# Patient Record
Sex: Male | Born: 1990 | Race: Black or African American | Hispanic: No | Marital: Single | Smoking: Current some day smoker
Health system: Southern US, Community
[De-identification: ages and names within clinical notes are randomized; demographics above are authoritative.]

---

## 2013-04-26 ENCOUNTER — Emergency Department (HOSPITAL_BASED_OUTPATIENT_CLINIC_OR_DEPARTMENT_OTHER)
Admission: EM | Admit: 2013-04-26 | Discharge: 2013-04-26 | Disposition: A | Discharge status: Home / Self Care | Payer: Self-pay | Attending: Emergency Medicine | Admitting: Emergency Medicine

## 2013-04-26 ENCOUNTER — Encounter (HOSPITAL_BASED_OUTPATIENT_CLINIC_OR_DEPARTMENT_OTHER): Payer: Self-pay | Admitting: *Deleted

## 2013-04-26 DIAGNOSIS — H10213 Acute toxic conjunctivitis, bilateral: Secondary | ICD-10-CM

## 2013-04-26 DIAGNOSIS — H10219 Acute toxic conjunctivitis, unspecified eye: Secondary | ICD-10-CM | POA: Insufficient documentation

## 2013-04-26 MED ORDER — FLUORESCEIN SODIUM 1 MG OP STRP
ORAL_STRIP | OPHTHALMIC | Status: AC
  Administered 2013-04-26: 06:00:00
  Filled 2013-04-26: qty 1

## 2013-04-26 MED ORDER — TETRACAINE HCL 0.5 % OP SOLN
OPHTHALMIC | Status: AC
  Administered 2013-04-26: 06:00:00
  Filled 2013-04-26: qty 2

## 2013-04-26 MED ORDER — TOBRAMYCIN-DEXAMETHASONE 0.3-0.1 % OP SUSP
2.0000 [drp] | Freq: Four times a day (QID) | OPHTHALMIC | Status: DC
  Administered 2013-04-26: 2 [drp] via OPHTHALMIC
  Filled 2013-04-26: qty 2.5

## 2013-04-26 NOTE — ED Notes (Signed)
MD at bedside. 

## 2013-04-26 NOTE — ED Notes (Signed)
Irritation and redness to bith eyes x 2 days

## 2013-04-26 NOTE — ED Provider Notes (Signed)
CSN: [id number]     Arrival date & time 04/26/13  0550 History   None    Chief Complaint  Patient presents with  . Eye Pain   (Consider location/radiation/quality/duration/timing/severity/associated sxs/prior Treatment) HPI This is a 22 year old male with a two-day history of eye irritation and erythema. He denies frank pain but states his eyes do burn. He is not sure what triggered it but it is exacerbated by a chemical mist he is exposed to at work. There's been some serous discharge. He denies visual acuity changes. He denies cold symptoms.  No past medical history on file. No past surgical history on file. No family history on file. History  Substance Use Topics  . Smoking status: Not on file  . Smokeless tobacco: Not on file  . Alcohol Use: Not on file    Review of Systems  All other systems reviewed and are negative.    Allergies  Review of patient's allergies indicates not on file.  Home Medications  No current outpatient prescriptions on file. BP 126/72  Pulse 60  Temp(Src) 98.3 F (36.8 C) (Oral)  Resp 18  Ht 5\' 9"  (1.753 m)  Wt 150 lb (68.04 kg)  BMI 22.14 kg/m2  SpO2 99%  Physical Exam General: Well-developed, well-nourished male in no acute distress; appearance consistent with age of record HENT: normocephalic; atraumatic Eyes: pupils equal, round and reactive to light; extraocular muscles intact; bilateral conjunctival erythema with serous discharge; no fluorescein uptake Neck: supple Heart: regular rate and rhythm Lungs: clear to auscultation bilaterally Abdomen: soft; nondistended Extremities: No deformity; full range of motion Neurologic: Awake, alert and oriented; motor function intact in all extremities and symmetric; no facial droop Skin: Warm and dry Psychiatric: Normal mood and affect    ED Course  Procedures (including critical care time)  MDM   We'll treat for chemical conjunctivitis.    Hanley Seamen, MD 04/26/13 [phone]

## 2013-06-12 ENCOUNTER — Encounter (HOSPITAL_BASED_OUTPATIENT_CLINIC_OR_DEPARTMENT_OTHER): Payer: Self-pay | Admitting: Emergency Medicine

## 2013-06-12 ENCOUNTER — Emergency Department (HOSPITAL_BASED_OUTPATIENT_CLINIC_OR_DEPARTMENT_OTHER): Payer: BC Managed Care – PPO

## 2013-06-12 ENCOUNTER — Emergency Department (HOSPITAL_BASED_OUTPATIENT_CLINIC_OR_DEPARTMENT_OTHER)
Admission: EM | Admit: 2013-06-12 | Discharge: 2013-06-12 | Disposition: A | Discharge status: Home / Self Care | Payer: BC Managed Care – PPO | Attending: Emergency Medicine | Admitting: Emergency Medicine

## 2013-06-12 DIAGNOSIS — F17200 Nicotine dependence, unspecified, uncomplicated: Secondary | ICD-10-CM | POA: Insufficient documentation

## 2013-06-12 DIAGNOSIS — S20211A Contusion of right front wall of thorax, initial encounter: Secondary | ICD-10-CM

## 2013-06-12 DIAGNOSIS — S20219A Contusion of unspecified front wall of thorax, initial encounter: Secondary | ICD-10-CM | POA: Insufficient documentation

## 2013-06-12 MED ORDER — HYDROCODONE-ACETAMINOPHEN 5-325 MG PO TABS
2.0000 | ORAL_TABLET | Freq: Once | ORAL | Status: AC
  Administered 2013-06-12: 1 via ORAL
  Administered 2013-06-12: 2 via ORAL
  Filled 2013-06-12: qty 2

## 2013-06-12 MED ORDER — HYDROCODONE-ACETAMINOPHEN 5-325 MG PO TABS
ORAL_TABLET | ORAL | Status: AC
  Administered 2013-06-12: 1 via ORAL
  Filled 2013-06-12: qty 1

## 2013-06-12 MED ORDER — IBUPROFEN 800 MG PO TABS: 800.0000 mg | ORAL_TABLET | Freq: Three times a day (TID) | ORAL | Status: DC

## 2013-06-12 MED ORDER — HYDROCODONE-ACETAMINOPHEN 5-325 MG PO TABS: 2.0000 | ORAL_TABLET | ORAL | Status: DC | PRN
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
## 2013-06-14 NOTE — ED Provider Notes (Signed)
History/physical exam/procedure(s) were performed by non-physician practitioner and as supervising physician I was immediately available for consultation/collaboration. I have reviewed all notes and am in agreement with care and plan.   Kaytlen Lightsey S Skylie Hiott, MD 06/14/13 1513 

## 2013-08-02 ENCOUNTER — Encounter (HOSPITAL_BASED_OUTPATIENT_CLINIC_OR_DEPARTMENT_OTHER): Payer: Self-pay | Admitting: Emergency Medicine

## 2013-08-02 ENCOUNTER — Emergency Department (HOSPITAL_BASED_OUTPATIENT_CLINIC_OR_DEPARTMENT_OTHER)
Admission: EM | Admit: 2013-08-02 | Discharge: 2013-08-02 | Disposition: A | Discharge status: Home / Self Care | Payer: BC Managed Care – PPO | Attending: Emergency Medicine | Admitting: Emergency Medicine

## 2013-08-02 DIAGNOSIS — Z791 Long term (current) use of non-steroidal anti-inflammatories (NSAID): Secondary | ICD-10-CM | POA: Insufficient documentation

## 2013-08-02 DIAGNOSIS — J020 Streptococcal pharyngitis: Secondary | ICD-10-CM

## 2013-08-02 DIAGNOSIS — R52 Pain, unspecified: Secondary | ICD-10-CM | POA: Insufficient documentation

## 2013-08-02 DIAGNOSIS — F17200 Nicotine dependence, unspecified, uncomplicated: Secondary | ICD-10-CM | POA: Insufficient documentation

## 2013-08-02 LAB — RAPID STREP SCREEN (MED CTR MEBANE ONLY): Streptococcus, Group A Screen (Direct): POSITIVE — AB

## 2013-08-02 MED ORDER — CEPHALEXIN 500 MG PO CAPS: 500.0000 mg | ORAL_CAPSULE | Freq: Four times a day (QID) | ORAL | Status: DC

## 2013-08-02 NOTE — ED Provider Notes (Signed)
CSN: [id number]     Arrival date & time 08/02/13  1831 History  This chart was scribed for Geoffery Lyons, MD by Landis Gandy, ED Scribe. This patient was seen in room MH01/MH01 and the patient's care was started at [facility] PM     Chief Complaint  Patient presents with  . Sore Throat   The history is provided by the patient. No language interpreter was used.  HPI Comments: Kevin Cummings is a 22 y.o. male who presents to the Emergency Department complaining of new, constant, gradually worsening sore throat onset this morning. His associated symptoms are body aches. He has not tried and OTC medications for his symptoms. He denies any cough, fever, nausea, vomiting, diarrhea. He also denies any recent sick contacts. He has no associated symptoms.   History reviewed. No pertinent past medical history. History reviewed. No pertinent past surgical history. No family history on file. History  Substance Use Topics  . Smoking status: Current Every Day Smoker    Types: Cigars  . Smokeless tobacco: Not on file  . Alcohol Use: Yes     Comment: socially    Review of Systems A complete 10 system review of systems was obtained and all systems are negative except as noted in the HPI and PMH.   Allergies  Review of patient's allergies indicates not on file.  Home Medications   Current Outpatient Rx  Name  Route  Sig  Dispense  Refill  . ibuprofen (ADVIL,MOTRIN) 800 MG tablet   Oral   Take 1 tablet (800 mg total) by mouth 3 (three) times daily.   21 tablet   0    Triage Vitals: BP 131/78  Pulse 88  Temp(Src) 98.9 F (37.2 C) (Oral)  Resp 20  Ht 5\' 9"  (1.753 m)  Wt 140 lb (63.504 kg)  BMI 20.67 kg/m2  SpO2 100% Physical Exam  Nursing note and vitals reviewed. Constitutional: He is oriented to person, place, and time. He appears well-developed and well-nourished. No distress.  HENT:  Head: Normocephalic and atraumatic.  Mouth/Throat: Posterior oropharyngeal erythema present. No  oropharyngeal exudate.  Oropharynx is erythematous w/o exudate.   Eyes: Conjunctivae and EOM are normal. No scleral icterus.  Neck: Normal range of motion.  Cardiovascular: Normal rate, regular rhythm, normal heart sounds and intact distal pulses.   Pulmonary/Chest: Effort normal and breath sounds normal. No respiratory distress. He has no wheezes. He has no rales.  Musculoskeletal: Normal range of motion.  Lymphadenopathy:    He has cervical adenopathy.  Neurological: He is alert and oriented to person, place, and time.  Skin: Skin is warm and dry. No rash noted. He is not diaphoretic. No erythema. No pallor.  Psychiatric: He has a normal mood and affect. His behavior is normal.    ED Course  Procedures (including critical care time) DIAGNOSTIC STUDIES: Oxygen Saturation is 100% on RA, normal by my interpretation.    COORDINATION OF CARE: 8:04 PM- Will order rapid strep test. Pt advised of plan for treatment and pt agrees.  Labs Review Labs Reviewed - No data to display Imaging Review No results found.    MDM  No diagnosis found. Strep test is positive. We'll treat with Keflex and when necessary followup   I personally performed the services described in this documentation, which was scribed in my presence. The recorded information has been reviewed and is accurate.       Geoffery Lyons, MD 08/02/13 2024

## 2013-08-02 NOTE — ED Notes (Signed)
Onset of sore throat, body aches this morning.  Denies nausea, vomiting, diarrhea, cough.

## 2013-09-14 ENCOUNTER — Encounter (HOSPITAL_BASED_OUTPATIENT_CLINIC_OR_DEPARTMENT_OTHER): Payer: Self-pay | Admitting: Emergency Medicine

## 2013-09-14 ENCOUNTER — Emergency Department (HOSPITAL_BASED_OUTPATIENT_CLINIC_OR_DEPARTMENT_OTHER)
Admission: EM | Admit: 2013-09-14 | Discharge: 2013-09-15 | Disposition: A | Discharge status: Home / Self Care | Payer: BC Managed Care – PPO | Attending: Emergency Medicine | Admitting: Emergency Medicine

## 2013-09-14 DIAGNOSIS — F17200 Nicotine dependence, unspecified, uncomplicated: Secondary | ICD-10-CM | POA: Insufficient documentation

## 2013-09-14 DIAGNOSIS — J029 Acute pharyngitis, unspecified: Secondary | ICD-10-CM | POA: Insufficient documentation

## 2013-09-14 LAB — RAPID STREP SCREEN (MED CTR MEBANE ONLY): Streptococcus, Group A Screen (Direct): NEGATIVE

## 2013-09-14 NOTE — ED Notes (Signed)
Pt c/o sore throat x 2 days- denies known fever

## 2013-09-14 NOTE — ED Notes (Signed)
Sore throat x 2 days,  Runny nose  Denies any other sx

## 2013-09-15 MED ORDER — BENZOCAINE (TOPICAL) 20 % EX AERO: 1.0000 "application " | INHALATION_SPRAY | Freq: Four times a day (QID) | CUTANEOUS | Status: DC | PRN

## 2013-09-15 NOTE — ED Provider Notes (Signed)
CSN: [id number]     Arrival date & time 09/14/13  2307 History   First MD Initiated Contact with Patient 09/15/13 0129     Chief Complaint  Patient presents with  . Sore Throat   (Consider location/radiation/quality/duration/timing/severity/associated sxs/prior Treatment) HPI This is a 23 year old male with a three-day history of sore throat. He is experiencing moderate pain with swallowing or talking. He denies fever or other cold symptoms such as nasal congestion, rhinorrhea, cough or shortness of breath. Symptoms are similar to strep throat he had in December.  History reviewed. No pertinent past medical history. History reviewed. No pertinent past surgical history. No family history on file. History  Substance Use Topics  . Smoking status: Current Every Day Smoker    Types: Cigars  . Smokeless tobacco: Never Used  . Alcohol Use: Yes     Comment: socially    Review of Systems  All other systems reviewed and are negative.    Allergies  Review of patient's allergies indicates no known allergies.  Home Medications   Current Outpatient Rx  Name  Route  Sig  Dispense  Refill  . cephALEXin (KEFLEX) 500 MG capsule   Oral   Take 1 capsule (500 mg total) by mouth 4 (four) times daily.   28 capsule   0   . ibuprofen (ADVIL,MOTRIN) 800 MG tablet   Oral   Take 1 tablet (800 mg total) by mouth 3 (three) times daily.   21 tablet   0    BP 121/72  Pulse 79  Temp(Src) 98.6 F (37 C) (Oral)  Resp 18  Ht 5\' 9"  (1.753 m)  Wt 149 lb (67.586 kg)  BMI 21.99 kg/m2  SpO2 100%  Physical Exam General: Well-developed, well-nourished male in no acute distress; appearance consistent with age of record HENT: normocephalic; atraumatic; no pharyngeal erythema or exudate Eyes: pupils equal, round and reactive to light; extraocular muscles intact Neck: supple Heart: regular rate and rhythm; no murmurs, rubs or gallops Lungs: clear to auscultation bilaterally Abdomen: soft;  nondistended; nontender; no masses or hepatosplenomegaly; bowel sounds present Extremities: No deformity; full range of motion Neurologic: Awake, alert and oriented; motor function intact in all extremities and symmetric; no facial droop Skin: Warm and dry Psychiatric: Normal mood and affect    ED Course  Procedures (including critical care time)   MDM   Nursing notes and vitals signs, including pulse oximetry, reviewed.  Summary of this visit's results, reviewed by myself:  Labs:  Results for orders placed during the hospital encounter of 09/14/13 (from the past 24 hour(s))  RAPID STREP SCREEN     Status: None   Collection Time    09/14/13 11:19 PM      Result Value Range   Streptococcus, Group A Screen (Direct) NEGATIVE  NEGATIVE        Hanley SeamenJohn L Hendrix Console, MD 09/15/13 0139

## 2013-09-16 LAB — CULTURE, GROUP A STREP

## 2014-01-05 ENCOUNTER — Telehealth (HOSPITAL_BASED_OUTPATIENT_CLINIC_OR_DEPARTMENT_OTHER): Payer: Self-pay | Admitting: Emergency Medicine

## 2014-01-05 ENCOUNTER — Encounter (HOSPITAL_BASED_OUTPATIENT_CLINIC_OR_DEPARTMENT_OTHER): Payer: Self-pay | Admitting: Emergency Medicine

## 2014-01-05 ENCOUNTER — Emergency Department (HOSPITAL_BASED_OUTPATIENT_CLINIC_OR_DEPARTMENT_OTHER)
Admission: EM | Admit: 2014-01-05 | Discharge: 2014-01-05 | Disposition: A | Discharge status: Home / Self Care | Payer: BC Managed Care – PPO | Attending: Emergency Medicine | Admitting: Emergency Medicine

## 2014-01-05 ENCOUNTER — Emergency Department (HOSPITAL_BASED_OUTPATIENT_CLINIC_OR_DEPARTMENT_OTHER): Payer: BC Managed Care – PPO

## 2014-01-05 DIAGNOSIS — S01511A Laceration without foreign body of lip, initial encounter: Secondary | ICD-10-CM

## 2014-01-05 DIAGNOSIS — R6884 Jaw pain: Secondary | ICD-10-CM

## 2014-01-05 DIAGNOSIS — S01501A Unspecified open wound of lip, initial encounter: Secondary | ICD-10-CM | POA: Insufficient documentation

## 2014-01-05 DIAGNOSIS — Z792 Long term (current) use of antibiotics: Secondary | ICD-10-CM | POA: Insufficient documentation

## 2014-01-05 DIAGNOSIS — Z23 Encounter for immunization: Secondary | ICD-10-CM | POA: Insufficient documentation

## 2014-01-05 DIAGNOSIS — F17200 Nicotine dependence, unspecified, uncomplicated: Secondary | ICD-10-CM | POA: Insufficient documentation

## 2014-01-05 DIAGNOSIS — Z791 Long term (current) use of non-steroidal anti-inflammatories (NSAID): Secondary | ICD-10-CM | POA: Insufficient documentation

## 2014-01-05 DIAGNOSIS — L089 Local infection of the skin and subcutaneous tissue, unspecified: Secondary | ICD-10-CM

## 2014-01-05 MED ORDER — TETANUS-DIPHTH-ACELL PERTUSSIS 5-2.5-18.5 LF-MCG/0.5 IM SUSP
0.5000 mL | Freq: Once | INTRAMUSCULAR | Status: AC
  Administered 2014-01-05: 0.5 mL via INTRAMUSCULAR
  Filled 2014-01-05: qty 0.5

## 2014-01-05 MED ORDER — OXYCODONE-ACETAMINOPHEN 5-325 MG PO TABS
2.0000 | ORAL_TABLET | Freq: Once | ORAL | Status: AC
  Administered 2014-01-05: 2 via ORAL
  Filled 2014-01-05: qty 2

## 2014-01-05 MED ORDER — CLINDAMYCIN HCL 150 MG PO CAPS: 150.0000 mg | ORAL_CAPSULE | Freq: Four times a day (QID) | ORAL | Status: DC

## 2014-01-05 MED ORDER — PENICILLIN V POTASSIUM 250 MG PO TABS
500.0000 mg | ORAL_TABLET | Freq: Once | ORAL | Status: AC
  Administered 2014-01-05: 500 mg via ORAL
  Filled 2014-01-05: qty 2

## 2014-01-05 NOTE — Discharge Instructions (Signed)
Facial Infection °You have an infection of your face. This requires special attention to help prevent serious problems. Infections in facial wounds can cause poor healing and scars. They can also spread to deeper tissues, especially around the eye. Wound and dental infections can lead to sinusitis, infection of the eye socket, and even meningitis. Permanent damage to the skin, eye, and nervous system may result if facial infections are not treated properly. With severe infections, hospital care for IV antibiotic injections may be needed if they don't respond to oral antibiotics. °Antibiotics must be taken for the full course to insure the infection is eliminated. If the infection came from a bad tooth, it may have to be extracted when the infection is under control. Warm compresses may be applied to reduce skin irritation and remove drainage. °You might need a tetanus shot now if: °· You cannot remember when your last tetanus shot was. °· You have never had a tetanus shot. °· The object that caused your wound was dirty. °If you need a tetanus shot, and you decide not to get one, there is a rare chance of getting tetanus. Sickness from tetanus can be serious. If you got a tetanus shot, your arm may swell, get red and warm to the touch at the shot site. This is common and not a problem. °SEEK IMMEDIATE MEDICAL CARE IF:  °· You have increased swelling, redness, or trouble breathing. °· You have a severe headache, dizziness, nausea, or vomiting. °· You develop problems with your eyesight. °· You have a fever. °Document Released: 09/10/2004 Document Revised: 10/26/2011 Document Reviewed: 08/03/2005 °[employer]® Patient Information ©2014 [employer]. ° °
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
## 2015-06-19 ENCOUNTER — Encounter (HOSPITAL_BASED_OUTPATIENT_CLINIC_OR_DEPARTMENT_OTHER): Payer: Self-pay

## 2015-06-19 ENCOUNTER — Emergency Department (HOSPITAL_BASED_OUTPATIENT_CLINIC_OR_DEPARTMENT_OTHER)
Admission: EM | Admit: 2015-06-19 | Discharge: 2015-06-19 | Disposition: A | Discharge status: Home / Self Care | Payer: Self-pay | Attending: Emergency Medicine | Admitting: Emergency Medicine

## 2015-06-19 DIAGNOSIS — Z792 Long term (current) use of antibiotics: Secondary | ICD-10-CM | POA: Insufficient documentation

## 2015-06-19 DIAGNOSIS — F17210 Nicotine dependence, cigarettes, uncomplicated: Secondary | ICD-10-CM | POA: Insufficient documentation

## 2015-06-19 DIAGNOSIS — N342 Other urethritis: Secondary | ICD-10-CM | POA: Insufficient documentation

## 2015-06-19 DIAGNOSIS — Z791 Long term (current) use of non-steroidal anti-inflammatories (NSAID): Secondary | ICD-10-CM | POA: Insufficient documentation

## 2015-06-19 LAB — URINALYSIS, ROUTINE W REFLEX MICROSCOPIC
Bilirubin Urine: NEGATIVE
Glucose, UA: NEGATIVE mg/dL
HGB URINE DIPSTICK: NEGATIVE
Ketones, ur: NEGATIVE mg/dL
NITRITE: NEGATIVE
PROTEIN: NEGATIVE mg/dL
Specific Gravity, Urine: 1.025 (ref 1.005–1.030)
UROBILINOGEN UA: 1 mg/dL (ref 0.0–1.0)
pH: 6.5 (ref 5.0–8.0)

## 2015-06-19 LAB — URINE MICROSCOPIC-ADD ON

## 2015-06-19 MED ORDER — ONDANSETRON 4 MG PO TBDP
4.0000 mg | ORAL_TABLET | Freq: Once | ORAL | Status: AC
  Administered 2015-06-19: 4 mg via ORAL
  Filled 2015-06-19: qty 1

## 2015-06-19 MED ORDER — AZITHROMYCIN 250 MG PO TABS
1000.0000 mg | ORAL_TABLET | Freq: Once | ORAL | Status: AC
  Administered 2015-06-19: 1000 mg via ORAL
  Filled 2015-06-19: qty 4

## 2015-06-19 MED ORDER — CEFTRIAXONE SODIUM 250 MG IJ SOLR
250.0000 mg | Freq: Once | INTRAMUSCULAR | Status: AC
  Administered 2015-06-19: 250 mg via INTRAMUSCULAR
  Filled 2015-06-19: qty 250
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
## 2015-06-19 NOTE — ED Notes (Signed)
Pt vomited up medications

## 2015-06-19 NOTE — Discharge Instructions (Signed)
Urethritis, Adult °Urethritis is an inflammation of the tube through which urine exits your bladder (urethra).  °CAUSES °Urethritis is often caused by an infection in your urethra. The infection can be viral, like herpes. The infection can also be bacterial, like gonorrhea. °RISK FACTORS °Risk factors of urethritis include: °· Having sex without using a condom. °· Having multiple sexual partners. °· Having poor hygiene. °SIGNS AND SYMPTOMS °Symptoms of urethritis are less noticeable in women than in men. These symptoms include: °· Burning feeling when you urinate (dysuria). °· Discharge from your urethra. °· Blood in your urine (hematuria). °· Urinating more than usual. °DIAGNOSIS  °To confirm a diagnosis of urethritis, your health care provider will do the following: °· Ask about your sexual history. °· Perform a physical exam. °· Have you provide a sample of your urine for lab testing. °· Use a cotton swab to gently collect a sample from your urethra for lab testing. °TREATMENT  °It is important to treat urethritis. Depending on the cause, untreated urethritis may lead to serious genital infections and possibly infertility. Urethritis caused by a bacterial infection is treated with antibiotic medicine. All sexual partners must be treated.  °HOME CARE INSTRUCTIONS °· Do not have sex until the test results are known and treatment is completed, even if your symptoms go away before you finish treatment. °· If you were prescribed an antibiotic, finish it all even if you start to feel better. °SEEK MEDICAL CARE IF:  °· Your symptoms are not improved in 3 days. °· Your symptoms are getting worse. °· You develop abdominal pain or pelvic pain (in women). °· You develop joint pain. °· You have a fever. °SEEK IMMEDIATE MEDICAL CARE IF:  °· You have severe pain in the belly, back, or side. °· You have repeated vomiting. °MAKE SURE YOU: °· Understand these instructions. °· Will watch your condition. °· Will get help right away  if you are not doing well or get worse. °  °This information is not intended to replace advice given to you by your health care provider. Make sure you discuss any questions you have with your health care provider. °  °Document Released: 01/27/2001 Document Revised: 12/18/2014 Document Reviewed: 04/03/2013 °Elsevier Interactive Patient Education ©2016 Elsevier Inc. ° °

## 2015-06-19 NOTE — ED Notes (Signed)
Pt verbalizes understanding of d/c instructions and denies any further needs at this time. 

## 2015-06-19 NOTE — ED Notes (Signed)
Pt c/o white penile discharge, painful urination and subjective fever since yesterday

## 2015-06-19 NOTE — ED Notes (Signed)
Pt c/o white discharge and painful urination.  He states he has had unprotected sex with one partner in the last month and was last tested for STI in April 2016 with no positive results.

## 2015-06-20 LAB — RPR: RPR Ser Ql: NONREACTIVE

## 2015-06-20 LAB — HIV ANTIBODY (ROUTINE TESTING W REFLEX): HIV Screen 4th Generation wRfx: NONREACTIVE

## 2015-06-21 ENCOUNTER — Telehealth (HOSPITAL_COMMUNITY): Payer: Self-pay

## 2015-06-21 LAB — GC/CHLAMYDIA PROBE AMP (~~LOC~~) NOT AT ARMC
Chlamydia: POSITIVE — AB
NEISSERIA GONORRHEA: POSITIVE — AB

## 2015-06-21 NOTE — Telephone Encounter (Signed)
[facility].  (+) gonorrhea and chlamydia.  Treated with Zithromax and Rocephin.  DHHS form completed and faxed.   06/21/2015 @ 15:48 LVM requesting callback.

## 2015-06-22 ENCOUNTER — Telehealth (HOSPITAL_COMMUNITY): Payer: Self-pay

## 2015-06-23 ENCOUNTER — Telehealth (HOSPITAL_COMMUNITY): Payer: Self-pay

## 2015-06-23 NOTE — Telephone Encounter (Signed)
Unable to reach by telephone. Letter sent to address on record.  

## 2015-07-10 ENCOUNTER — Telehealth (HOSPITAL_COMMUNITY): Payer: Self-pay

## 2015-07-10 NOTE — Telephone Encounter (Signed)
Unable to reach by phone or mail.  Chart closed.   

## 2015-09-08 ENCOUNTER — Emergency Department (HOSPITAL_BASED_OUTPATIENT_CLINIC_OR_DEPARTMENT_OTHER)
Admission: EM | Admit: 2015-09-08 | Discharge: 2015-09-08 | Disposition: A | Discharge status: Home / Self Care | Payer: Self-pay | Attending: Emergency Medicine | Admitting: Emergency Medicine

## 2015-09-08 ENCOUNTER — Encounter (HOSPITAL_BASED_OUTPATIENT_CLINIC_OR_DEPARTMENT_OTHER): Payer: Self-pay | Admitting: Emergency Medicine

## 2015-09-08 DIAGNOSIS — F17210 Nicotine dependence, cigarettes, uncomplicated: Secondary | ICD-10-CM | POA: Insufficient documentation

## 2015-09-08 DIAGNOSIS — H6123 Impacted cerumen, bilateral: Secondary | ICD-10-CM | POA: Insufficient documentation

## 2015-09-08 DIAGNOSIS — H6692 Otitis media, unspecified, left ear: Secondary | ICD-10-CM | POA: Insufficient documentation

## 2015-09-08 DIAGNOSIS — H9202 Otalgia, left ear: Secondary | ICD-10-CM

## 2015-09-08 MED ORDER — AMOXICILLIN 500 MG PO CAPS
500.0000 mg | ORAL_CAPSULE | Freq: Once | ORAL | Status: AC
  Administered 2015-09-08: 500 mg via ORAL
  Filled 2015-09-08: qty 1

## 2015-09-08 MED ORDER — CARBAMIDE PEROXIDE 6.5 % OT SOLN: 5.0000 [drp] | Freq: Two times a day (BID) | OTIC | Status: DC

## 2015-09-08 MED ORDER — IBUPROFEN 800 MG PO TABS
800.0000 mg | ORAL_TABLET | Freq: Once | ORAL | Status: AC
  Administered 2015-09-08: 800 mg via ORAL
  Filled 2015-09-08: qty 1

## 2015-09-08 NOTE — ED Notes (Signed)
PA at bedside.

## 2015-09-08 NOTE — Discharge Instructions (Signed)
1. Medications: debrox, usual home medications 2. Treatment: rest, drink plenty of fluids, do not use q-tips 3. Follow Up: Please followup with your primary doctor in 2-3 days for discussion of your diagnoses and further evaluation after today's visit; if you do not have a primary care doctor use the resource guide provided to find one; Please return to the ER for worsening symptoms    Cerumen Impaction The structures of the external ear canal secrete a waxy substance known as cerumen. Excess cerumen can build up in the ear canal, causing a condition known as cerumen impaction. Cerumen impaction can cause ear pain and disrupt the function of the ear. The rate of cerumen production differs for each individual. In certain individuals, the configuration of the ear canal may decrease his or her ability to naturally remove cerumen. CAUSES Cerumen impaction is caused by excessive cerumen production or buildup. RISK FACTORS  Frequent use of swabs to clean ears.  Having narrow ear canals.  Having eczema.  Being dehydrated. SIGNS AND SYMPTOMS  Diminished hearing.  Ear drainage.  Ear pain.  Ear itch. TREATMENT Treatment may involve:  Over-the-counter or prescription ear drops to soften the cerumen.  Removal of cerumen by a health care provider. This may be done with:  Irrigation with warm water. This is the most common method of removal.  Ear curettes and other instruments.  Surgery. This may be done in severe cases. HOME CARE INSTRUCTIONS  Take medicines only as directed by your health care provider.  Do not insert objects into the ear with the intent of cleaning the ear. PREVENTION  Do not insert objects into the ear, even with the intent of cleaning the ear. Removing cerumen as a part of normal hygiene is not necessary, and the use of swabs in the ear canal is not recommended.  Drink enough water to keep your urine clear or pale yellow.  Control your eczema if you have  it. SEEK MEDICAL CARE IF:  You develop ear pain.  You develop bleeding from the ear.  The cerumen does not clear after you use ear drops as directed.   This information is not intended to replace advice given to you by your health care provider. Make sure you discuss any questions you have with your health care provider.   Document Released: 09/10/2004 Document Revised: 08/24/2014 Document Reviewed: 03/20/2015 Elsevier Interactive Patient Education 2016 [employer].    Emergency Department Resource Guide 1) Find a Doctor and Pay Out of Pocket Although you won't have to find out who is covered by your insurance plan, it is a good idea to ask around and get recommendations. You will then need to call the office and see if the doctor you have chosen will accept you as a new patient and what types of options they offer for patients who are self-pay. Some doctors offer discounts or will set up payment plans for their patients who do not have insurance, but you will need to ask so you aren't surprised when you get to your appointment.  2) Contact Your Local Health Department Not all health departments have doctors that can see patients for sick visits, but many do, so it is worth a call to see if yours does. If you don't know where your local health department is, you can check in your phone book. The CDC also has a tool to help you locate your state's health department, and many state websites also have listings of all of their local health  departments.  3) Find a Walk-in Clinic If your illness is not likely to be very severe or complicated, you may want to try a walk in clinic. These are popping up all over the country in pharmacies, drugstores, and shopping centers. They're usually staffed by nurse practitioners or physician assistants that have been trained to treat common illnesses and complaints. They're usually fairly quick and inexpensive. However, if you have serious medical issues or  chronic medical problems, these are probably not your best option.  No Primary Care Doctor: - Call Health Connect at  [phone] - they can help you locate a primary care doctor that  accepts your insurance, provides certain services, etc. - Physician Referral Service- [phone]  Chronic Pain Problems: Organization         Address  Phone   Notes  [facility]  [phone] Patients need to be referred by their primary care doctor.   Medication Assistance: Organization         Address  Phone   Notes  [facility] Med[facility] [address] [place], [place] [address] [phone] --Must be a resident of [facility] -- Must have NO insurance coverage whatsoever (no Medicaid/ Medicare, etc.) -- The pt. MUST have a primary care doctor that directs their care regularly and follows them in the community   [facility]ist  [phone]   [employer]  [phone]    Agencies that provide inexpensive medical care: Organization         Address  Phone   Notes  [facility]  [phone]   [facility]    [phone]   [facility] [address] [place], [place] [address] [phone]   [facility] [address] [place] [phone]   Planned Parenthood    [phone]   [facility]    [phone]   [facility]  [address], [place] Phone:  [phone], Fax:  [phone] Hours of Operation:  9 am - 6 pm, M-F.  Also accepts Medicaid/Medicare and self-pay.  [facility]  [address], [place] Phone: [phone], Fax: [phone]. Hours of Operation:  8:30 am - 5:30 pm, M-F.  Also accepts Medicaid and self-pay.  [facility] [place] [address] [place] Phone: [phone]   [facility] [address] Natasha Bence [place], [place]  [phone], Ext. [phone] Mondays & Thursdays: 7-9 AM.  First 15 patients are seen on a first come, first serve basis.    Medicaid-accepting [facility] Providers:  Organization         Address  Phone   Notes  [facility] [address], [place] [phone] Also accepts self-pay patients.  [facility] [address] Laurell Josephs [place], [place]  [phone]   [facility] [address], [place] [phone]   [facility] [address] [place] [phone]   Renaye Rakers [address], [place]   [phone] Only accepts [place] patients after they have their name applied to their card.   Self-Pay (no insurance) in [facility]:  Organization         Address  Phone   Notes  Sickle Cell Patients, [facility] [address] [place], [place] [phone]   [facility] [address] [place], [place] [phone]   [facility]  [address],  [phone]   [facility]/Dr. Osei-Bonsu  [address] [place] or [address], [place] [phone] Phone number for both [place] and [place] locations is the same.  [facility] [address], [place] [phone]   [facility] [address] [place] or [address] [phone]   [facility] [address] [place] [phone], phone; [phone], fax Sees patients 1st and 3rd Saturday of every month.  Must not qualify for public or private insurance (i.e. Medicaid, Medicare, [place] Health Choice, Veterans' Benefits)  Household income should be no more than 200% of the poverty level The clinic cannot treat you if you are pregnant or think you are pregnant  Sexually transmitted  diseases are not treated at the clinic.    Dental Care: Organization         Address  Phone  Notes  [facility] [address] [place], [place] [phone] Accepts children up to age 87 who are enrolled in [place] or [place] Health Choice; pregnant women with a Medicaid card; and children who have applied for Medicaid or [place] Health Choice, but were declined, whose parents can pay a reduced fee at time of service.  [facility] of [facility] [place]  [address], [place] [phone] Accepts children up to age 71 who are enrolled in [place] or [place] Health Choice; pregnant women with a Medicaid card; and children who have applied for Medicaid or  Health Choice, but were declined, whose parents can pay a reduced fee at time of service.  [facility]  [address] [place], [place] [phone] Patients are seen by appointment only. Walk-ins are not accepted. [facility] will see patients 35 years of age and older. Monday - Tuesday (8am-5pm) Most Wednesdays (8:30-5pm) $30 per visit, cash only  [facility] Adult Dental Access [facility]  [address], [facility] [phone] Patients are seen by appointment only. Walk-ins are not accepted. [facility] will see patients 70 years of age and older. One Wednesday Evening (Monthly: Volunteer Based).  $30 per visit, cash only  [employer]  [phone] for adults; Children under age 45, call Graduate Pediatric Dentistry at [phone]. Children aged 66-14, please call [phone] to request a pediatric application.  Dental services are provided in all areas of dental care including fillings, crowns and bridges, complete and partial dentures, implants, gum treatment, root canals, and extractions. Preventive care is also provided. Treatment is provided to both adults and children. Patients are selected via a  lottery and there is often a waiting list.   [facility] [address], [place]  [phone] [email or web address]   [facility] [address] [place], [place] [phone], Ext. [phone] Second and Fourth Thursday of each month, opens at 6:30 AM; Clinic ends at 9 AM.  Patients are seen on a first-come first-served basis, and a limited number are seen during each clinic.   [facility]  [address] Ether Griffins [place], [place] [phone]   Eligibility Requirements You must have lived  in [place], [place], or [place] counties for at least the last three months.   You cannot be eligible for state or federal sponsored National Cityhealthcare insurance, including CIGNAVeterans Administration, [place]icaid, or [employer]icare.   You generally cannot be eligible for healthcare insurance through your employer.    How to apply: Eligibility screenings are held every Tuesday and Wednesday afternoon from 1:00 pm until 4:00 pm. You do not need an appointment for the interview!  [facility] [address], [place], [place] [phone]   [facility]  [phone]   [facility]  [phone]   [facility]  [phone]    Behavioral Health Resources in the Community: Intensive Outpatient Programs Organization         Address  Phone  Notes  [facility] [address], [place], [place] [phone]   [facility] [address], [place], [place] [phone]   [facility] [address], [place], [place]  [phone]   [facility] [address],  [place], [place] [phone] or [phone]   Substance Abuse Resources Organization         Address  Phone  Notes  [facility]  [phone]   [facility]  [phone]   [facility]  [phone]   [facility]  [phone]   Residential &  Outpatient Substance Abuse Program  [phone]   Psychological Services Organization         Address  Phone  Notes  [facility]  [phone]- [phone]   [facility]  [phone]- [phone]   [facility] [address], [place] [phone] or [phone]    Mobile Crisis Teams Organization         Address  Phone  Notes  [facility], Mobile Crisis Care Unit  [phone]   [facility]  [address] [place], [place] [phone]   Doristine LocksSharon DeEsch [address] [place] [phone]    Self-Help/Support Groups Organization         Address  Phone             Notes  [facility] [place] - variety of support groups  [phone]- [phone] Call for more information  Narcotics Anonymous (NA), [facility] [address], [place]  2 meetings at this location   Statisticianesidential Treatment Programs Organization         Address  Phone  Notes  [facility] [address],    [place]  [phone]   [facility]  [address], [place] [phone], [place], [place] [phone]   [facility] [address], [place] [phone] Admissions: 8am-3pm M-F  [facility] [address]    [place], [place] [phone]   [facility] [address], [place], [place] [phone]   [facility] [address]  [place], [place] [phone]   [facility] - Intensive Outpatient [address], [place], [place] [phone]   [facility] ([facility] [address]  [place], [place] [phone] or [phone]   [facility] ([facility]) [address] [place], [place] [phone] Accepts Medicaid  [facility] [address]  [place] [phone] Substance Abuse/Addiction Treatment   [facility] Resources Organization          Address  Phone  Notes  [facility]  [phone]   Angie FavaJulie Brannon, PhD [address], [address] [place], [place]   [phone] or [phone]   [facility]   [address] [place], [place] [phone]   [facility] [address], [place], [place] [phone] Insurance/Medicaid/sponsorship through [facility] Families [address]                                    [place], [place] [phone] Therapy/tele-psych/case  [facility] [address].   [place], [place] [phone]    Dr. Lolly MustacheArfeen  [phone]   [facility]  [employer] [facility] 1) [address],  2) [address], [place] 3)  [address], [place] [phone]  [phone]   [facility] Child Abuse Hotline [phone] or [phone] (After Hours)

## 2015-09-08 NOTE — ED Provider Notes (Signed)
CSN: [id number]     Arrival date & time 09/08/15  1940 History   First MD Initiated Contact with Patient 09/08/15 2028     Chief Complaint  Patient presents with  . Ear Pain     (Consider location/radiation/quality/duration/timing/severity/associated sxs/prior Treatment) The history is provided by the patient and medical records. No language interpreter was used.   Kevin Cummings is a 25 y.o. male  with no major medical history presents to the Emergency Department complaining of gradual, persistent, progressively worsening left otalgia onset 2 days ago. Patient reports he attempted to clean his ear with a Q-tip today but that did not improve the pain. No other treatments are to arrival. Patient denies sick contacts. No hydrating or alleviating factors. He denies rhinorrhea, post nasal drip, sore throat, cough, fevers, chills. He does endorse associated decreased hearing bilaterally.  History reviewed. No pertinent past medical history. History reviewed. No pertinent past surgical history. History reviewed. No pertinent family history. Social History  Substance Use Topics  . Smoking status: Current Every Day Smoker    Types: Cigars  . Smokeless tobacco: Never Used  . Alcohol Use: Yes     Comment: socially    Review of Systems  Constitutional: Negative for fever, diaphoresis, appetite change, fatigue and unexpected weight change.  HENT: Positive for ear pain. Negative for mouth sores.   Eyes: Negative for visual disturbance.  Respiratory: Negative for cough, chest tightness, shortness of breath and wheezing.   Cardiovascular: Negative for chest pain.  Gastrointestinal: Negative for nausea, vomiting, abdominal pain, diarrhea and constipation.  Endocrine: Negative for polydipsia, polyphagia and polyuria.  Genitourinary: Negative for dysuria, urgency, frequency and hematuria.  Musculoskeletal: Negative for back pain and neck stiffness.  Skin: Negative for rash.  Allergic/Immunologic:  Negative for immunocompromised state.  Neurological: Negative for syncope, light-headedness and headaches.  Hematological: Does not bruise/bleed easily.  Psychiatric/Behavioral: Negative for sleep disturbance. The patient is not nervous/anxious.       Allergies  Review of patient's allergies indicates no known allergies.  Home Medications   Prior to Admission medications   Medication Sig Start Date End Date Taking? Authorizing Provider  carbamide peroxide (DEBROX) 6.5 % otic solution Place 5 drops into both ears 2 (two) times daily. 09/08/15   Kenny Stern, PA-C   BP 133/72 mmHg  Pulse 69  Temp(Src) 98.3 F (36.8 C) (Oral)  Resp 18  Ht  (1.753 m)  Wt 63.504 kg  BMI 20.67 kg/m2  SpO2 99% Physical Exam  Constitutional: He is oriented to person, place, and time. He appears well-developed and well-nourished. No distress.  HENT:  Head: Normocephalic and atraumatic.  Right Ear: External ear normal.  Left Ear: External ear normal.  Nose: No mucosal edema or rhinorrhea. No epistaxis. Right sinus exhibits no maxillary sinus tenderness and no frontal sinus tenderness. Left sinus exhibits no maxillary sinus tenderness and no frontal sinus tenderness.  Mouth/Throat: Uvula is midline, oropharynx is clear and moist and mucous membranes are normal. Mucous membranes are not pale and not cyanotic. No oropharyngeal exudate, posterior oropharyngeal edema, posterior oropharyngeal erythema or tonsillar abscesses.  Cerumen impaction bilaterally  Eyes: Conjunctivae are normal. Pupils are equal, round, and reactive to light.  Neck: Normal range of motion and full passive range of motion without pain.  Cardiovascular: Normal rate and intact distal pulses.   Pulmonary/Chest: Effort normal and breath sounds normal. No stridor.  Clear and equal breath sounds without focal wheezes, rhonchi, rales  Abdominal: Soft. Bowel sounds are  normal. There is no tenderness.  Musculoskeletal: Normal range  of motion.  Lymphadenopathy:    He has no cervical adenopathy.  Neurological: He is alert and oriented to person, place, and time.  Skin: Skin is warm and dry. No rash noted. He is not diaphoretic.  Psychiatric: He has a normal mood and affect.  Nursing note and vitals reviewed.   ED Course  .Ear Cerumen Removal Date/Time: 09/08/2015 8:45 PM Performed by: Dierdre Forth Authorized by: Dierdre Forth Consent: Verbal consent obtained. Risks and benefits: risks, benefits and alternatives were discussed Consent given by: patient Patient understanding: patient states understanding of the procedure being performed Patient consent: the patient's understanding of the procedure matches consent given Procedure consent: procedure consent matches procedure scheduled Relevant documents: relevant documents present and verified Site marked: the operative site was marked Required items: required blood products, implants, devices, and special equipment available Patient identity confirmed: verbally with patient and arm band Time out: Immediately prior to procedure a "time out" was called to verify the correct patient, procedure, equipment, support staff and site/side marked as required. Local anesthetic: none Location details: left ear Procedure type: irrigation Patient sedated: no Patient tolerance: Patient tolerated the procedure well with no immediate complications   (including critical care time) Labs Review Labs Reviewed - No data to display  Imaging Review No results found. I have personally reviewed and evaluated these images and lab results as part of my medical decision-making.   EKG Interpretation None      MDM   Final diagnoses:  Otalgia of left ear  Acute left otitis media, recurrence not specified, unspecified otitis media type  Cerumen impaction, bilateral   Kevin Cummings presents with cerumen impaction bilaterally. Left otalgia. After cerumen removal left  TM appears erythematous though partially obscured by persistent cerumen.  Will treat for otitis media of the left ear. Discussed home remedies for cerumen removal and proper ear care no evidence of mastoiditis.    Dahlia Client Boleslaus Holloway, PA-C 09/08/15 2206  Loren Racer, MD 09/08/15 [phone]

## 2015-09-08 NOTE — ED Notes (Addendum)
Patient states that he is having pain to his left ear x 2 days.

## 2016-09-26 ENCOUNTER — Emergency Department (HOSPITAL_BASED_OUTPATIENT_CLINIC_OR_DEPARTMENT_OTHER): Payer: No Typology Code available for payment source

## 2016-09-26 ENCOUNTER — Encounter (HOSPITAL_BASED_OUTPATIENT_CLINIC_OR_DEPARTMENT_OTHER): Payer: Self-pay | Admitting: *Deleted

## 2016-09-26 ENCOUNTER — Emergency Department (HOSPITAL_BASED_OUTPATIENT_CLINIC_OR_DEPARTMENT_OTHER)
Admission: EM | Admit: 2016-09-26 | Discharge: 2016-09-26 | Disposition: A | Discharge status: Home / Self Care | Payer: No Typology Code available for payment source | Attending: Emergency Medicine | Admitting: Emergency Medicine

## 2016-09-26 DIAGNOSIS — S161XXA Strain of muscle, fascia and tendon at neck level, initial encounter: Secondary | ICD-10-CM | POA: Insufficient documentation

## 2016-09-26 DIAGNOSIS — F17290 Nicotine dependence, other tobacco product, uncomplicated: Secondary | ICD-10-CM | POA: Diagnosis not present

## 2016-09-26 DIAGNOSIS — Y92410 Unspecified street and highway as the place of occurrence of the external cause: Secondary | ICD-10-CM | POA: Diagnosis not present

## 2016-09-26 DIAGNOSIS — Y939 Activity, unspecified: Secondary | ICD-10-CM | POA: Insufficient documentation

## 2016-09-26 DIAGNOSIS — S199XXA Unspecified injury of neck, initial encounter: Secondary | ICD-10-CM | POA: Diagnosis present

## 2016-09-26 DIAGNOSIS — Y999 Unspecified external cause status: Secondary | ICD-10-CM | POA: Diagnosis not present

## 2016-09-26 DIAGNOSIS — M25512 Pain in left shoulder: Secondary | ICD-10-CM

## 2016-09-26 MED ORDER — NAPROXEN 375 MG PO TABS: 375.0000 mg | ORAL_TABLET | Freq: Two times a day (BID) | ORAL | 0 refills | Status: DC

## 2016-09-26 MED ORDER — CYCLOBENZAPRINE HCL 10 MG PO TABS: 10.0000 mg | ORAL_TABLET | Freq: Two times a day (BID) | ORAL | 0 refills | Status: DC | PRN

## 2016-09-26 MED ORDER — KETOROLAC TROMETHAMINE 30 MG/ML IJ SOLN
30.0000 mg | Freq: Once | INTRAMUSCULAR | Status: AC
  Administered 2016-09-26: 30 mg via INTRAMUSCULAR
  Filled 2016-09-26: qty 1
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
## 2016-09-26 NOTE — Discharge Instructions (Signed)
All of your xray are normal. This is likely musculoskeletal strain. Please take the naproxen starting tomorrow up to 2 times per day. Do not take extra motrin or advil. You may take tylenol. Have also given you script for flexeril for muscle spasm Take as prescribed. This will make you drowsy so do not drive with it. Follow up in 3-4 days if not improved. Return to the ED before if symptoms worsen.  SEEK IMMEDIATE MEDICAL ATTENTION IF: New numbness, tingling, weakness, or problem with the use of your arms or legs.  Severe back pain not relieved with medications.  Change in bowel or bladder control.  Urinary retention.  Numbness in your groin.  Increasing pain in any areas of the body (such as chest or abdominal pain).  Shortness of breath, dizziness or fainting.  Nausea (feeling sick to your stomach), vomiting, fever, or sweats.
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
## 2016-09-26 NOTE — ED Notes (Signed)
Pt made aware to return if symptoms worsen or if any life threatening symptoms occur.   

## 2016-11-22 ENCOUNTER — Emergency Department (HOSPITAL_BASED_OUTPATIENT_CLINIC_OR_DEPARTMENT_OTHER)
Admission: EM | Admit: 2016-11-22 | Discharge: 2016-11-22 | Disposition: A | Discharge status: Home / Self Care | Payer: Self-pay | Attending: Emergency Medicine | Admitting: Emergency Medicine

## 2016-11-22 ENCOUNTER — Encounter (HOSPITAL_BASED_OUTPATIENT_CLINIC_OR_DEPARTMENT_OTHER): Payer: Self-pay | Admitting: *Deleted

## 2016-11-22 DIAGNOSIS — J020 Streptococcal pharyngitis: Secondary | ICD-10-CM | POA: Insufficient documentation

## 2016-11-22 DIAGNOSIS — F17290 Nicotine dependence, other tobacco product, uncomplicated: Secondary | ICD-10-CM | POA: Insufficient documentation

## 2016-11-22 LAB — RAPID STREP SCREEN (MED CTR MEBANE ONLY): STREPTOCOCCUS, GROUP A SCREEN (DIRECT): POSITIVE — AB

## 2016-11-22 MED ORDER — ACETAMINOPHEN 325 MG PO TABS
650.0000 mg | ORAL_TABLET | Freq: Once | ORAL | Status: AC
  Administered 2016-11-22: 650 mg via ORAL
  Filled 2016-11-22: qty 2

## 2016-11-22 MED ORDER — PENICILLIN G BENZATHINE & PROC 1200000 UNIT/2ML IM SUSP
1.2000 10*6.[IU] | Freq: Once | INTRAMUSCULAR | Status: AC
  Administered 2016-11-22: 1.2 10*6.[IU] via INTRAMUSCULAR
  Filled 2016-11-22: qty 2

## 2016-11-22 MED ORDER — LIDOCAINE VISCOUS 2 % MT SOLN: 20.0000 mL | OROMUCOSAL | 0 refills | Status: DC | PRN

## 2016-11-22 NOTE — Discharge Instructions (Signed)
You have been given penicillin here in ED. You do not need antibiotics to go home with. Please use warm saltwater rinses at home. Please take Tylenol or ibuprofen as needed for any fevers. Please use viscous lidocaine as needed for throat pain.   Get help right away if: You have new symptoms, such as vomiting, severe headache, stiff or painful neck, chest pain, or shortness of breath. You have severe throat pain, drooling, or changes in your voice. You have swelling of the neck, or the skin on the neck becomes red and tender. You have signs of dehydration, such as fatigue, dry mouth, and decreased urination. You become increasingly sleepy, or you cannot wake up completely. Your joints become red or painful.

## 2016-11-22 NOTE — ED Triage Notes (Signed)
Pt reports sore throat since yesterday morning with body aches. Denies cough, cold symptoms, n/v/d.

## 2016-11-22 NOTE — ED Provider Notes (Signed)
[facility] Note   CSN: [id number] Arrival date & time: 11/22/16  1633 By signing my name below, I, Kevin Cummings, attest that this documentation has been prepared under the direction and in the presence of Kevin Cummings, [place]. Electronically Signed: Bridgette Cummings, ED Scribe. 11/22/16. 5:05 PM.  History   Chief Complaint Chief Complaint  Patient presents with  . Sore Throat    HPI The history is provided by the patient. No language interpreter was used.   HPI Comments: Kevin Cummings is a 26 y.o. male with no pertinent PMHx, who presents to the Emergency Department complaining of gradually worsening, sore throat beginning yesterday morning with associated subjective fever, headache, and generalized arthralgias/myalgias. Pain is exacerbated with swallowing. He has taken Theraflu with mild, temporary relief. No known sick contacts with similar symptoms. Pt does not have a PCP he regularly follows up with at this time. Pt denies chills, difficulty breathing, trouble swallowing, congestion, cough, or any other associated symptoms.  History reviewed. No pertinent past medical history.  There are no active problems to display for this patient.   History reviewed. No pertinent surgical history.     Home Medications    Prior to Admission medications   Medication Sig Start Date End Date Taking? Authorizing Provider  cyclobenzaprine (FLEXERIL) 10 MG tablet Take 1 tablet (10 mg total) by mouth 2 (two) times daily as needed for muscle spasms. 09/26/16   Rise Mu, PA-C  lidocaine (XYLOCAINE) 2 % solution Use as directed 20 mLs in the mouth or throat as needed for mouth pain. 11/22/16   Mohammad Granade Manuel [place], [place]  naproxen (NAPROSYN) 375 MG tablet Take 1 tablet (375 mg total) by mouth 2 (two) times daily. 09/26/16   Rise Mu, PA-C    Family History No family history on file.  Social History Social History  Substance Use Topics  . Smoking status:  Current Some Day Smoker    Types: Cigars  . Smokeless tobacco: Never Used  . Alcohol use Yes     Comment: socially     Allergies   Patient has no known allergies.   Review of Systems Review of Systems  Constitutional: Positive for fever. Negative for chills.  HENT: Positive for sore throat. Negative for congestion and trouble swallowing.   Respiratory: Negative for cough and shortness of breath.   Musculoskeletal: Positive for arthralgias and myalgias.  Neurological: Positive for headaches.  All other systems reviewed and are negative.  Physical Exam Updated Vital Signs BP 129/79 (BP Location: Right Arm)   Pulse (!) 102   Temp 100.3 F (37.9 C) (Oral)   Resp 16   Ht  (1.753 m)   Wt 68 kg   SpO2 100%   BMI 22.15 kg/m   Physical Exam  Constitutional: He is oriented to person, place, and time. He appears well-developed and well-nourished.  Well appearing. Airway patent. No stridor, no drooling, no trismus.  HENT:  Head: Normocephalic and atraumatic.  Right Ear: External ear normal.  Left Ear: External ear normal.  Nose: Nose normal.  Mouth/Throat: Oropharynx is clear and moist. No oropharyngeal exudate.  Oropharynx with evidence of mild redness. Tonsils with evidence of mild redness, No obvious tonsillar swelling, or exudates. TM's appear normal with no evidence of bulging. EAC appear non erythematous and not swollen  Eyes: EOM are normal. Pupils are equal, round, and reactive to light.  Neck: Normal range of motion.  Normal ROM. No nuchal rigidity.   Cardiovascular:  Normal rate and normal heart sounds.   Pulmonary/Chest: Effort normal and breath sounds normal. No respiratory distress. He has no wheezes. He has no rales.  Lungs CTA. No wheezing. No rales. No stridor. Normal work of breathing  Abdominal: Soft. There is no tenderness. There is no rebound and no guarding.  Soft and nontender. No rebound. No guarding. Negative murphy's sign. No focal tenderness at  McBurney's point. No CVA tenderness. No evidence of hernia  Lymphadenopathy:    He has no cervical adenopathy.  Neurological: He is alert and oriented to person, place, and time.  Skin: Skin is warm.  Psychiatric: He has a normal mood and affect. His behavior is normal.  Nursing note and vitals reviewed.  ED Treatments / Results  DIAGNOSTIC STUDIES: Oxygen Saturation is 100% on RA, normal by my interpretation.   COORDINATION OF CARE: 5:05 PM-Discussed next steps with pt. Pt verbalized understanding and is agreeable with the plan.   Labs (all labs ordered are listed, but only abnormal results are displayed) Labs Reviewed  RAPID STREP SCREEN (NOT AT [facility]) - Abnormal; Notable for the following:       Result Value   Streptococcus, Group A Screen (Direct) POSITIVE (*)    All other components within normal limits    EKG  EKG Interpretation None       Radiology No results found.  Procedures Procedures (including critical care time)  Medications Ordered in ED Medications  penicillin g procaine-penicillin g benzathine (BICILLIN-CR) injection 600000-600000 units (not administered)  acetaminophen (TYLENOL) tablet 650 mg (650 mg Oral Given 11/22/16 1705)     Initial Impression / Assessment and Plan / ED Course  I have reviewed the triage vital signs and the nursing notes.  Pertinent labs & imaging results that were available during my care of the patient were reviewed by me and considered in my medical decision making (see chart for details).     Pt rapid strep test positive. Pt is tolerating secretions. No evidence of dehydration. Presentation not concerning for peritonsillar abscess or spread of infection to deep spaces of the throat; patent airway. Pt will be discharged with penicillin.  Specific return precautions discussed. Recommended PCP follow up. Pt appears safe for discharge.    Final Clinical Impressions(s) / ED Diagnoses   Final diagnoses:  Strep pharyngitis      New Prescriptions New Prescriptions   LIDOCAINE (XYLOCAINE) 2 % SOLUTION    Use as directed 20 mLs in the mouth or throat as needed for mouth pain.   I personally performed the services described in this documentation, which was scribed in my presence. The recorded information has been reviewed and is accurate.    [address] [place], [place] 11/22/16 1728    Rolan Bucco, MD 11/22/16 1800

## 2017-11-06 ENCOUNTER — Emergency Department (HOSPITAL_BASED_OUTPATIENT_CLINIC_OR_DEPARTMENT_OTHER)
Admission: EM | Admit: 2017-11-06 | Discharge: 2017-11-06 | Disposition: A | Discharge status: Home / Self Care | Payer: Self-pay | Attending: Emergency Medicine | Admitting: Emergency Medicine

## 2017-11-06 ENCOUNTER — Encounter (HOSPITAL_BASED_OUTPATIENT_CLINIC_OR_DEPARTMENT_OTHER): Payer: Self-pay | Admitting: Emergency Medicine

## 2017-11-06 ENCOUNTER — Other Ambulatory Visit: Payer: Self-pay

## 2017-11-06 DIAGNOSIS — J029 Acute pharyngitis, unspecified: Secondary | ICD-10-CM | POA: Insufficient documentation

## 2017-11-06 DIAGNOSIS — F17290 Nicotine dependence, other tobacco product, uncomplicated: Secondary | ICD-10-CM | POA: Insufficient documentation

## 2017-11-06 LAB — RAPID STREP SCREEN (MED CTR MEBANE ONLY): STREPTOCOCCUS, GROUP A SCREEN (DIRECT): NEGATIVE

## 2017-11-06 MED ORDER — LIDOCAINE VISCOUS 2 % MT SOLN: 20.0000 mL | OROMUCOSAL | 0 refills | Status: DC | PRN

## 2017-11-06 MED ORDER — DEXAMETHASONE SODIUM PHOSPHATE 10 MG/ML IJ SOLN
10.0000 mg | Freq: Once | INTRAMUSCULAR | Status: AC
  Administered 2017-11-06: 10 mg
  Filled 2017-11-06: qty 1

## 2017-11-06 MED ORDER — ACETAMINOPHEN 325 MG PO TABS
650.0000 mg | ORAL_TABLET | Freq: Once | ORAL | Status: AC | PRN
  Administered 2017-11-06: 650 mg via ORAL
  Filled 2017-11-06: qty 2

## 2017-11-06 MED ORDER — PENICILLIN G BENZATHINE 1200000 UNIT/2ML IM SUSP
1.2000 10*6.[IU] | Freq: Once | INTRAMUSCULAR | Status: AC
  Administered 2017-11-06: 1.2 10*6.[IU] via INTRAMUSCULAR
  Filled 2017-11-06: qty 2

## 2017-11-06 NOTE — ED Triage Notes (Signed)
Sore throat since yesterday.

## 2017-11-06 NOTE — ED Provider Notes (Signed)
[facility] EMERGENCY DEPARTMENT Provider Note   CSN: [id number] Arrival date & time: 11/06/17  1557     History   Chief Complaint Chief Complaint  Patient presents with  . Sore Throat    HPI Kevin Cummings is a 27 y.o. male.  HPI 27 year old male with no pertinent past medical history presents to the ED for evaluation of sore throat.  States that it started last night and progressively worsened this morning.  That he woke up this morning with a fever.  States that it hurts to swallow.  Patient also reports some rhinorrhea and congestion.  Denies any known sick contacts.  Patient does not take anything for symptoms prior to arrival.  Nothing makes better.  He denies any associated productive cough.  Has any chills or body aches.  Denies any otalgia. History reviewed. No pertinent past medical history.  There are no active problems to display for this patient.   History reviewed. No pertinent surgical history.      Home Medications    Prior to Admission medications   Medication Sig Start Date End Date Taking? Authorizing Provider  cyclobenzaprine (FLEXERIL) 10 MG tablet Take 1 tablet (10 mg total) by mouth 2 (two) times daily as needed for muscle spasms. 09/26/16   Demetrios LollLeaphart, Leather Estis T, PA-C  lidocaine (XYLOCAINE) 2 % solution Use as directed 20 mLs in the mouth or throat as needed for mouth pain. 11/06/17   Rise MuLeaphart, Roby Spalla T, PA-C  naproxen (NAPROSYN) 375 MG tablet Take 1 tablet (375 mg total) by mouth 2 (two) times daily. 09/26/16   Rise MuLeaphart, Alvin Rubano T, PA-C    Family History No family history on file.  Social History Social History   Tobacco Use  . Smoking status: Current Some Day Smoker    Types: Cigars  . Smokeless tobacco: Never Used  Substance Use Topics  . Alcohol use: Yes    Comment: socially  . Drug use: No     Allergies   Patient has no known allergies.   Review of Systems Review of Systems  All other systems reviewed and are  negative.    Physical Exam Updated Vital Signs BP 121/71 (BP Location: Left Arm)   Pulse (!) 102   Temp (!) 100.7 F (38.2 C) (Oral)   Resp 18   Ht 5\' 8"  (1.727 m)   Wt 77.1 kg (170 lb)   SpO2 98%   BMI 25.85 kg/m   Physical Exam  Constitutional: He appears well-developed and well-nourished. No distress.  HENT:  Head: Normocephalic and atraumatic.  Right Ear: Tympanic membrane and ear canal normal. No drainage.  Left Ear: Tympanic membrane and ear canal normal. No drainage.  Mouth/Throat: Uvula is midline and mucous membranes are normal. No uvula swelling. Oropharyngeal exudate, posterior oropharyngeal edema and posterior oropharyngeal erythema present. No tonsillar abscesses. Tonsils are 2+ on the right. Tonsils are 2+ on the left. Tonsillar exudate.  Managing secretions tolerating airway.  No muffled voice.  Uvula midline.  No trismus.  Tolerating p.o. fluids appropriately.  Eyes: Right eye exhibits no discharge. Left eye exhibits no discharge. No scleral icterus.  Neck: Normal range of motion. Neck supple.  Cardiovascular:  Tachycardia improved with fever reduction.  Pulmonary/Chest: Effort normal and breath sounds normal. No stridor. No respiratory distress. He has no wheezes. He has no rhonchi. He has no rales. He exhibits no tenderness.  Abdominal: Soft. Bowel sounds are normal.  Musculoskeletal: Normal range of motion.  Lymphadenopathy:    He  has cervical adenopathy.  Neurological: He is alert.  Skin: Skin is warm and dry. Capillary refill takes less than 2 seconds. No pallor.  Psychiatric: His behavior is normal. Judgment and thought content normal.  Nursing note and vitals reviewed.    ED Treatments / Results  Labs (all labs ordered are listed, but only abnormal results are displayed) Labs Reviewed  RAPID STREP SCREEN (NOT AT [facility])  CULTURE, GROUP A STREP [facility])    EKG None  Radiology No results found.  Procedures Procedures (including critical care  time)  Medications Ordered in ED Medications  acetaminophen (TYLENOL) tablet 650 mg (650 mg Oral Given 11/06/17 1613)  dexamethasone (DECADRON) injection 10 mg (10 mg Other Given 11/06/17 1645)  penicillin g benzathine (BICILLIN LA) 1200000 UNIT/2ML injection 1.2 Million Units (1.2 Million Units Intramuscular Given 11/06/17 1645)     Initial Impression / Assessment and Plan / ED Course  I have reviewed the triage vital signs and the nursing notes.  Pertinent labs & imaging results that were available during my care of the patient were reviewed by me and considered in my medical decision making (see chart for details).     Pt febrile with tonsillar exudate, cervical lymphadenopathy, & dysphagia; diagnosis of strep. Treated in the Ed with steroids, NSAIDs, Pain medication and PCN IM.  Pt appears mildly dehydrated, discussed importance of water rehydration.  Lungs clear to auscultation bilaterally.  No indication for imaging at this time.  Very improved with anti-paretic medication.  Presentation non concerning for PTA or infxn spread to soft tissue. No trismus or uvula deviation. Specific return precautions discussed. Pt able to drink water in ED without difficulty with intact air way. Recommended PCP follow up.   Pt is hemodynamically stable, in NAD, & able to ambulate in the ED. Evaluation does not show pathology that would require ongoing emergent intervention or inpatient treatment. I explained the diagnosis to the patient. Pain has been managed & has no complaints prior to dc. Pt is comfortable with above plan and is stable for discharge at this time. All questions were answered prior to disposition. Strict return precautions for f/u to the ED were discussed. Encouraged follow up with PCP.    Final Clinical Impressions(s) / ED Diagnoses   Final diagnoses:  Sore throat    ED Discharge Orders        Ordered    lidocaine (XYLOCAINE) 2 % solution  As needed     11/06/17 1650        Wallace Keller 11/06/17 1654    Long, Arlyss Repress, MD 11/07/17 1154

## 2017-11-06 NOTE — Discharge Instructions (Signed)
Your strep test was negative however he been treated for strep throat in the ED today.  Have been given to help with your fever.  I recommend that you take Motrin when you get home to control your fever.  Continue Motrin and Tylenol around-the-clock to keep her fever and pain down.  Have given you lidocaine to rinse with to help with your sore throat.  Follow-up with your primary care doctor return the ED with any worsening symptoms.

## 2017-11-06 NOTE — ED Notes (Signed)
Pt given water for PO challenge 

## 2017-11-09 LAB — CULTURE, GROUP A STREP (THRC)

## 2017-12-06 ENCOUNTER — Other Ambulatory Visit: Payer: Self-pay

## 2017-12-06 ENCOUNTER — Encounter (HOSPITAL_BASED_OUTPATIENT_CLINIC_OR_DEPARTMENT_OTHER): Payer: Self-pay | Admitting: Emergency Medicine

## 2017-12-06 ENCOUNTER — Emergency Department (HOSPITAL_BASED_OUTPATIENT_CLINIC_OR_DEPARTMENT_OTHER)
Admission: EM | Admit: 2017-12-06 | Discharge: 2017-12-06 | Disposition: A | Discharge status: Home / Self Care | Payer: Self-pay | Attending: Emergency Medicine | Admitting: Emergency Medicine

## 2017-12-06 DIAGNOSIS — H6123 Impacted cerumen, bilateral: Secondary | ICD-10-CM | POA: Insufficient documentation

## 2017-12-06 DIAGNOSIS — F17290 Nicotine dependence, other tobacco product, uncomplicated: Secondary | ICD-10-CM | POA: Insufficient documentation

## 2017-12-06 DIAGNOSIS — Z79899 Other long term (current) drug therapy: Secondary | ICD-10-CM | POA: Insufficient documentation

## 2017-12-06 MED ORDER — CARBAMIDE PEROXIDE 6.5 % OT SOLN: 5.0000 [drp] | Freq: Two times a day (BID) | OTIC | 0 refills | Status: DC

## 2017-12-06 NOTE — Discharge Instructions (Addendum)
It was my pleasure taking care of you today!   Debrox drops to both ears twice daily. This will help soften ear wax. You can irrigate ears with warm water every other day to help as well. Place warm water in ear and lay on side for 1 minute then turn head and let water / wax drain out.   If you are still experiencing symptoms, you can follow up with the ENT (ear, nose, throat) for further evaluation.   Return to ER for new or worsening symptoms, any additional concerns.

## 2017-12-06 NOTE — ED Provider Notes (Signed)
[facility] EMERGENCY DEPARTMENT Provider Note   CSN: [id number] Arrival date & time: 12/06/17  1718     History   Chief Complaint Chief Complaint  Patient presents with  . Cerumen Impaction    HPI Kevin Cummings is a 27 y.o. male.  The history is provided by the patient and medical records. No language interpreter was used.   Kevin Cummings is a 27 y.o. male with no known PMH who presents to the Emergency Department complaining of persistent bilateral ear fullness and difficulty hearing over the last year. Denies ear pain. No cough, congestion, sore throat. No dizziness, headaches, lightheadedness. No medications or treatments prior to arrival for symptoms.  History reviewed. No pertinent past medical history.  There are no active problems to display for this patient.   History reviewed. No pertinent surgical history.      Home Medications    Prior to Admission medications   Medication Sig Start Date End Date Taking? Authorizing Provider  carbamide peroxide (DEBROX) 6.5 % OTIC solution Place 5 drops into both ears 2 (two) times daily. 12/06/17   Ople Girgis, Chase Picket, PA-C  cyclobenzaprine (FLEXERIL) 10 MG tablet Take 1 tablet (10 mg total) by mouth 2 (two) times daily as needed for muscle spasms. 09/26/16   Demetrios Loll T, PA-C  lidocaine (XYLOCAINE) 2 % solution Use as directed 20 mLs in the mouth or throat as needed for mouth pain. 11/06/17   Rise Mu, PA-C  naproxen (NAPROSYN) 375 MG tablet Take 1 tablet (375 mg total) by mouth 2 (two) times daily. 09/26/16   Rise Mu, PA-C    Family History No family history on file.  Social History Social History   Tobacco Use  . Smoking status: Current Some Day Smoker    Types: Cigars  . Smokeless tobacco: Never Used  Substance Use Topics  . Alcohol use: Yes    Comment: socially  . Drug use: No     Allergies   Patient has no known allergies.   Review of Systems Review of Systems   Constitutional: Negative for chills and fever.  HENT: Positive for hearing loss. Negative for congestion, rhinorrhea, sore throat and tinnitus.        + ear fullness  Respiratory: Negative for cough.      Physical Exam Updated Vital Signs BP 125/78   Pulse 76   Temp 98.8 F (37.1 C) (Oral)   Resp 17   Ht 5\' 8"  (1.727 m)   Wt 77.1 kg (170 lb)   SpO2 100%   BMI 25.85 kg/m   Physical Exam  Constitutional: He appears well-developed and well-nourished. No distress.  HENT:  Head: Normocephalic and atraumatic.  Bilateral cerumen impaction.   Neck: Neck supple.  Cardiovascular: Normal rate, regular rhythm and normal heart sounds.  No murmur heard. Pulmonary/Chest: Effort normal and breath sounds normal. No respiratory distress. He has no wheezes. He has no rales.  Musculoskeletal: Normal range of motion.  Neurological: He is alert.  Skin: Skin is warm and dry.  Nursing note and vitals reviewed.    ED Treatments / Results  Labs (all labs ordered are listed, but only abnormal results are displayed) Labs Reviewed - No data to display  EKG None  Radiology No results found.  Procedures .Ear Cerumen Removal Date/Time: 12/06/2017 7:01 PM Performed by: Emmons Toth, Chase Picket, PA-C Authorized by: Enriqueta Augusta, Chase Picket, PA-C   Consent:    Consent obtained:  Verbal   Consent given by:  Patient   Risks discussed:  Pain, TM perforation and incomplete removal   Alternatives discussed:  No treatment Procedure details:    Location:  L ear and R ear   Procedure type: curette   Post-procedure details:    Inspection:  TM intact   Hearing quality:  Improved   Patient tolerance of procedure:  Tolerated well, no immediate complications   (including critical care time)  Medications Ordered in ED Medications - No data to display   Initial Impression / Assessment and Plan / ED Course  I have reviewed the triage vital signs and the nursing notes.  Pertinent labs & imaging results  that were available during my care of the patient were reviewed by me and considered in my medical decision making (see chart for details).     Kevin Cummings is a 27 y.o. male who presents to ED for decreased hearing from bilateral ears and sensation of ear fullness. Bilateral cerumen impaction on exam. Nursing staff irrigated with large amount of cerumen removed. Still had impaction. Cerumen removed performed by me with curette bilaterally with large amount of wax able to be removed. TM without signs of infection. Still has fair amount of hard wax - will place on debrox. PCP or ENT follow up if symptoms persist. All questions answered.    Final Clinical Impressions(s) / ED Diagnoses   Final diagnoses:  Bilateral impacted cerumen    ED Discharge Orders        Ordered    carbamide peroxide (DEBROX) 6.5 % OTIC solution  2 times daily     12/06/17 1854       Brynlynn Walko, Chase PicketJaime Pilcher, PA-C 12/06/17 1903    Pricilla LovelessGoldston, Scott, MD 12/07/17 1013

## 2017-12-06 NOTE — ED Triage Notes (Signed)
Pt reports difficulty hearing in bilateral ears.

## 2018-05-31 ENCOUNTER — Encounter (HOSPITAL_BASED_OUTPATIENT_CLINIC_OR_DEPARTMENT_OTHER): Payer: Self-pay | Admitting: Emergency Medicine

## 2018-05-31 ENCOUNTER — Emergency Department (HOSPITAL_BASED_OUTPATIENT_CLINIC_OR_DEPARTMENT_OTHER): Payer: Self-pay

## 2018-05-31 ENCOUNTER — Other Ambulatory Visit: Payer: Self-pay

## 2018-05-31 ENCOUNTER — Emergency Department (HOSPITAL_BASED_OUTPATIENT_CLINIC_OR_DEPARTMENT_OTHER)
Admission: EM | Admit: 2018-05-31 | Discharge: 2018-05-31 | Disposition: A | Discharge status: Home / Self Care | Payer: Self-pay | Attending: Emergency Medicine | Admitting: Emergency Medicine

## 2018-05-31 DIAGNOSIS — M25511 Pain in right shoulder: Secondary | ICD-10-CM | POA: Insufficient documentation

## 2018-05-31 DIAGNOSIS — F17290 Nicotine dependence, other tobacco product, uncomplicated: Secondary | ICD-10-CM | POA: Insufficient documentation
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
## 2018-05-31 NOTE — ED Provider Notes (Signed)
[facility] EMERGENCY DEPARTMENT Provider Note   CSN: [id number] Arrival date & time: 05/31/18  5284     History   Chief Complaint Chief Complaint  Patient presents with  . Shoulder Pain    HPI Kevin Cummings is a 27 y.o. male.  The history is provided by the patient.  He complains of pain in his right shoulder for the last month.  It is especially painful if he tries to reach forward with his right arm.  Pain is been getting worse.  He rates it at 8/10.  He denies any numbness or tingling.  He states he thinks he injured it doing some heavy lifting at a previous job.  On the job, he had to lift items with his hands very far apart.  Pain seems to be localized to the posterior aspect of the shoulder.  He has not done anything to treat it.  History reviewed. No pertinent past medical history.  There are no active problems to display for this patient.   History reviewed. No pertinent surgical history.      Home Medications    Prior to Admission medications   Not on File    Family History No family history on file.  Social History Social History   Tobacco Use  . Smoking status: Current Some Day Smoker    Types: Cigars  . Smokeless tobacco: Never Used  Substance Use Topics  . Alcohol use: Yes    Comment: socially  . Drug use: No     Allergies   Patient has no known allergies.   Review of Systems Review of Systems  All other systems reviewed and are negative.    Physical Exam Updated Vital Signs BP 120/80 (BP Location: Left Arm)   Pulse 71   Temp 98.2 F (36.8 C) (Oral)   Resp 16   Ht 5\' 7"  (1.702 m)   Wt 65.8 kg   SpO2 99%   BMI 22.71 kg/m   Physical Exam  Nursing note and vitals reviewed.  27 year old male, resting comfortably and in no acute distress. Vital signs are normal. Oxygen saturation is 99%, which is normal. Head is normocephalic and atraumatic. PERRLA, EOMI. Oropharynx is clear. Neck is nontender and supple without  adenopathy or JVD. Back is nontender and there is no CVA tenderness. Lungs are clear without rales, wheezes, or rhonchi. Chest is nontender. Heart has regular rate and rhythm without murmur. Abdomen is soft, flat, nontender without masses or hepatosplenomegaly and peristalsis is normoactive. Extremities: Normal appearance of the right shoulder.  There is no tenderness to palpation.  Range of motion is restricted for abduction and forward flexion.  Rotator cuff impingement signs are present, but there is no tenderness.  Distal neurovascular exam is normal with strong pulses, normal sensation, normal strength of distal musculature, and prompt capillary refill.. Skin is warm and dry without rash. Neurologic: Mental status is normal, cranial nerves are intact, there are no motor or sensory deficits.  ED Treatments / Results   Radiology Dg Shoulder Right  Result Date: 05/31/2018 CLINICAL DATA:  Right shoulder pain for 2 months. Increased pain over the last 2 days. Patient works as a Special educational needs teacher. EXAM: RIGHT SHOULDER - 2+ VIEW COMPARISON:  None. FINDINGS: No evidence of acute fracture or dislocation of the right shoulder. Coracoclavicular and acromioclavicular spaces are maintained. On the Y-view, there is suggestion of cortical loss and focal lucency in the inferior scapula. This is probably artifact but a destructive  bone lesion or infection may be present. Suggest obtaining scapular views for further evaluation. IMPRESSION: 1. No acute bony abnormalities demonstrated in the right shoulder. 2. Possible cortical loss in focal lucency in the inferior scapula versus artifact. Suggest obtaining scapular views for further evaluation. Electronically Signed   By: Burman Nieves M.D.   On: 05/31/2018 06:51    Procedures Procedures   Medications Ordered in ED Medications - No data to display   Initial Impression / Assessment and Plan / ED Course  I have reviewed the triage vital signs and the  nursing notes.  Pertinent imaging results that were available during my care of the patient were reviewed by me and considered in my medical decision making (see chart for details).  Right shoulder pain.  Physical examination is significant for decreased range of motion, and rotator cuff impingement signs.  I suspect that his primary problem is rotator cuff tendinitis and he has now developed some degree of adhesive capsulitis.  Old records are reviewed, and he has no relevant past visits.  He is sent for shoulder x-rays.  Shoulder x-rays are unremarkable. He is advised to use ice, take OTC NSAID's. He is referred to sports medicine for follow up.  Final Clinical Impressions(s) / ED Diagnoses   Final diagnoses:  Right shoulder pain, unspecified chronicity    ED Discharge Orders    None       Dione Booze, MD 05/31/18 [phone]

## 2018-05-31 NOTE — Discharge Instructions (Addendum)
Apply ice several times a day. Take two naproxen tablets at a time, twice a day.

## 2019-08-23 IMAGING — DX DG SHOULDER 2+V*R*
3 series · 3 of 3 positions shown · non-contrast
Comparison: None.

CLINICAL DATA: Right shoulder pain for 2 months. Increased pain
over the last 2 days. Patient works as a furniture mover.

EXAM:
RIGHT SHOULDER - 2+ VIEW

[shoulder grashey]
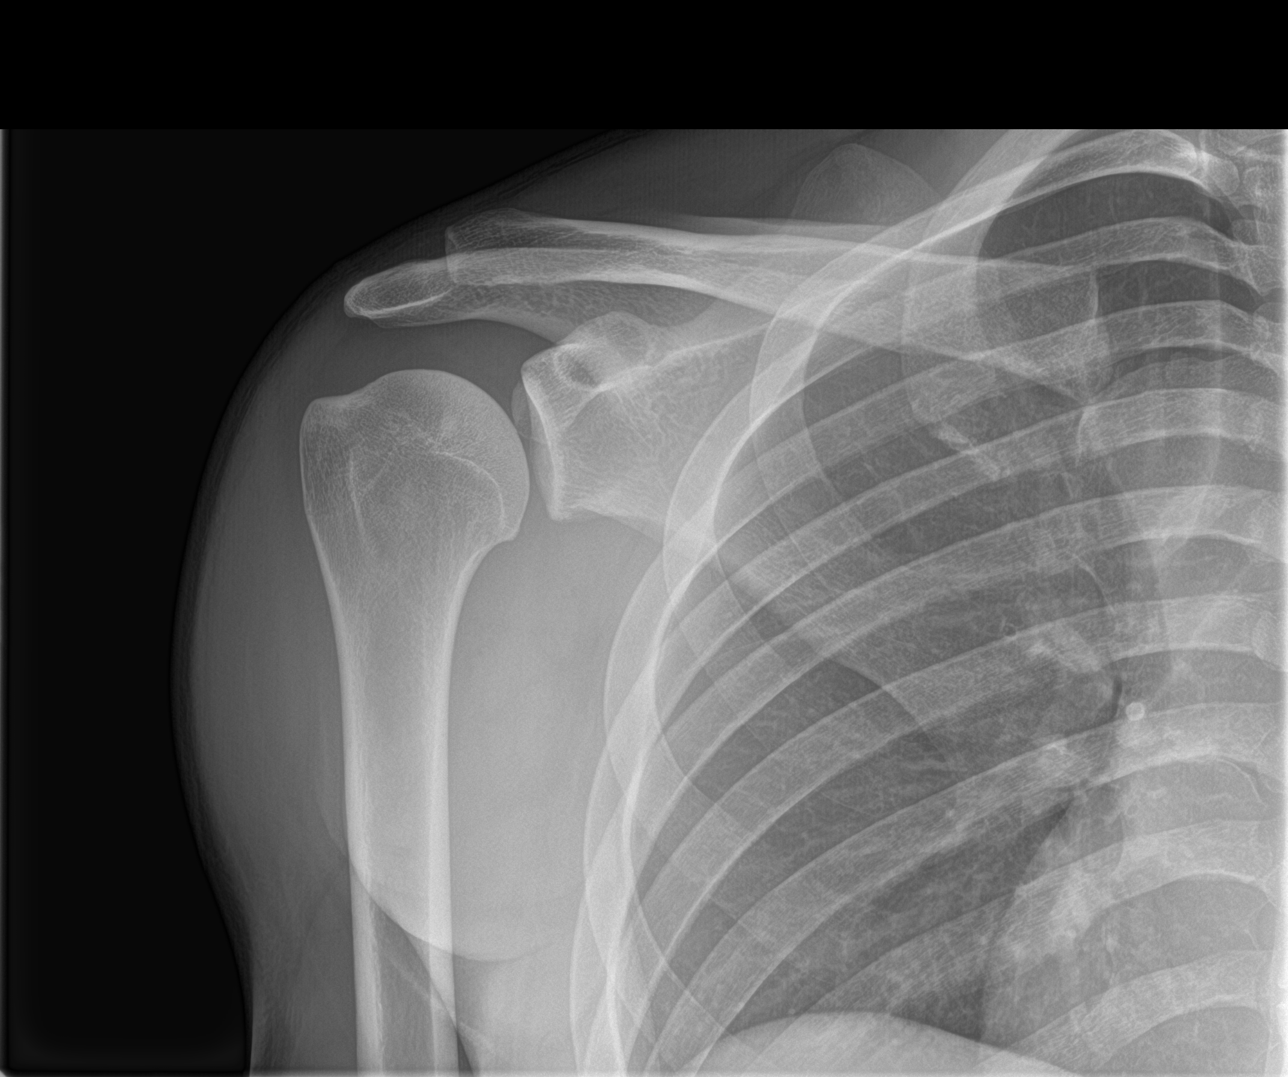

[shoulder y view]
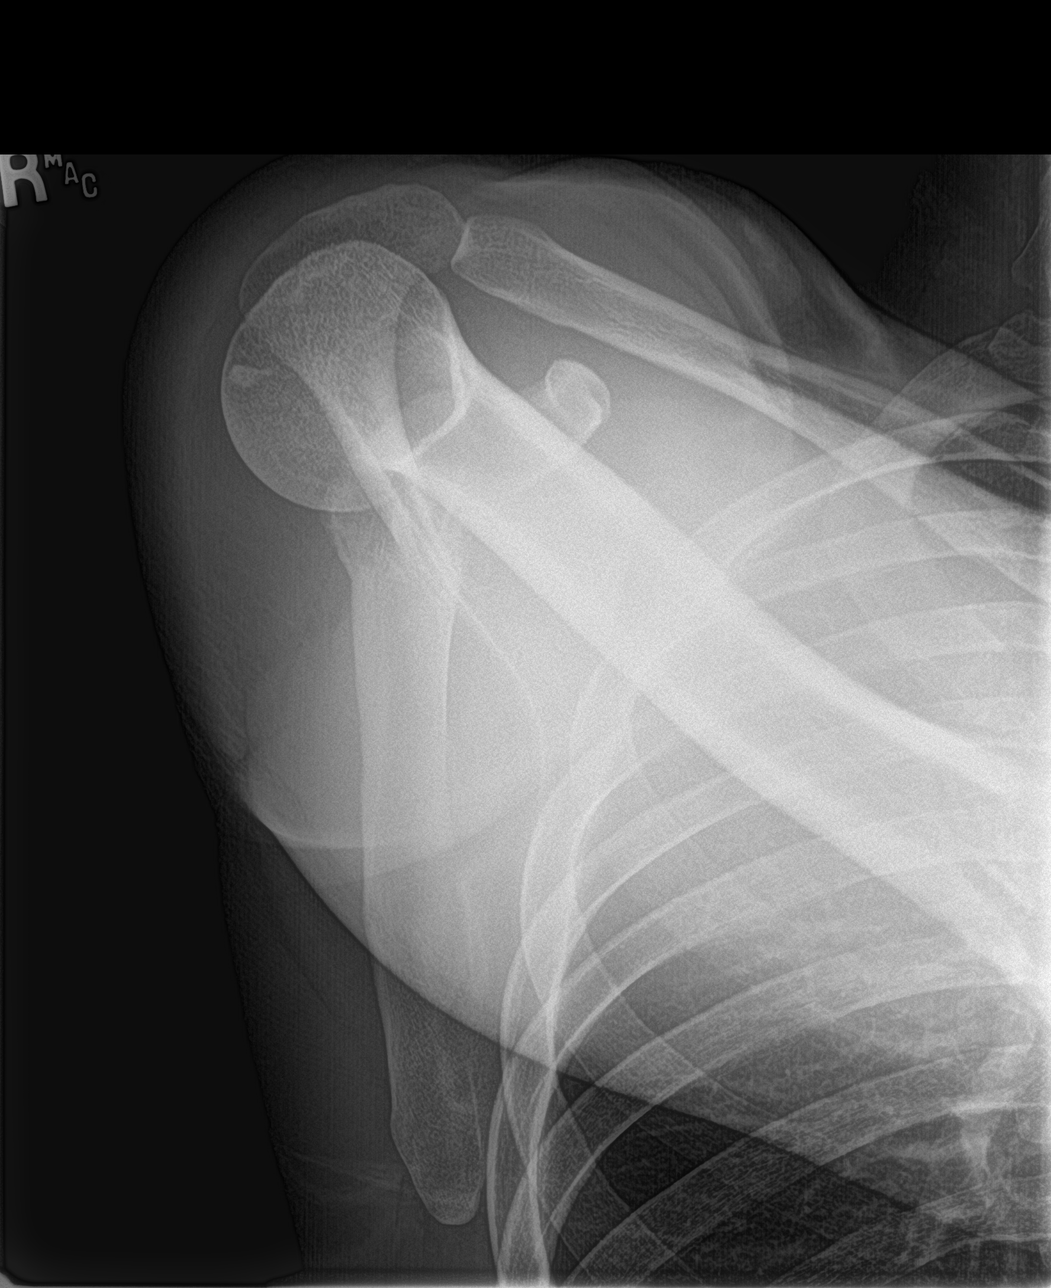

[shoulder axillary]
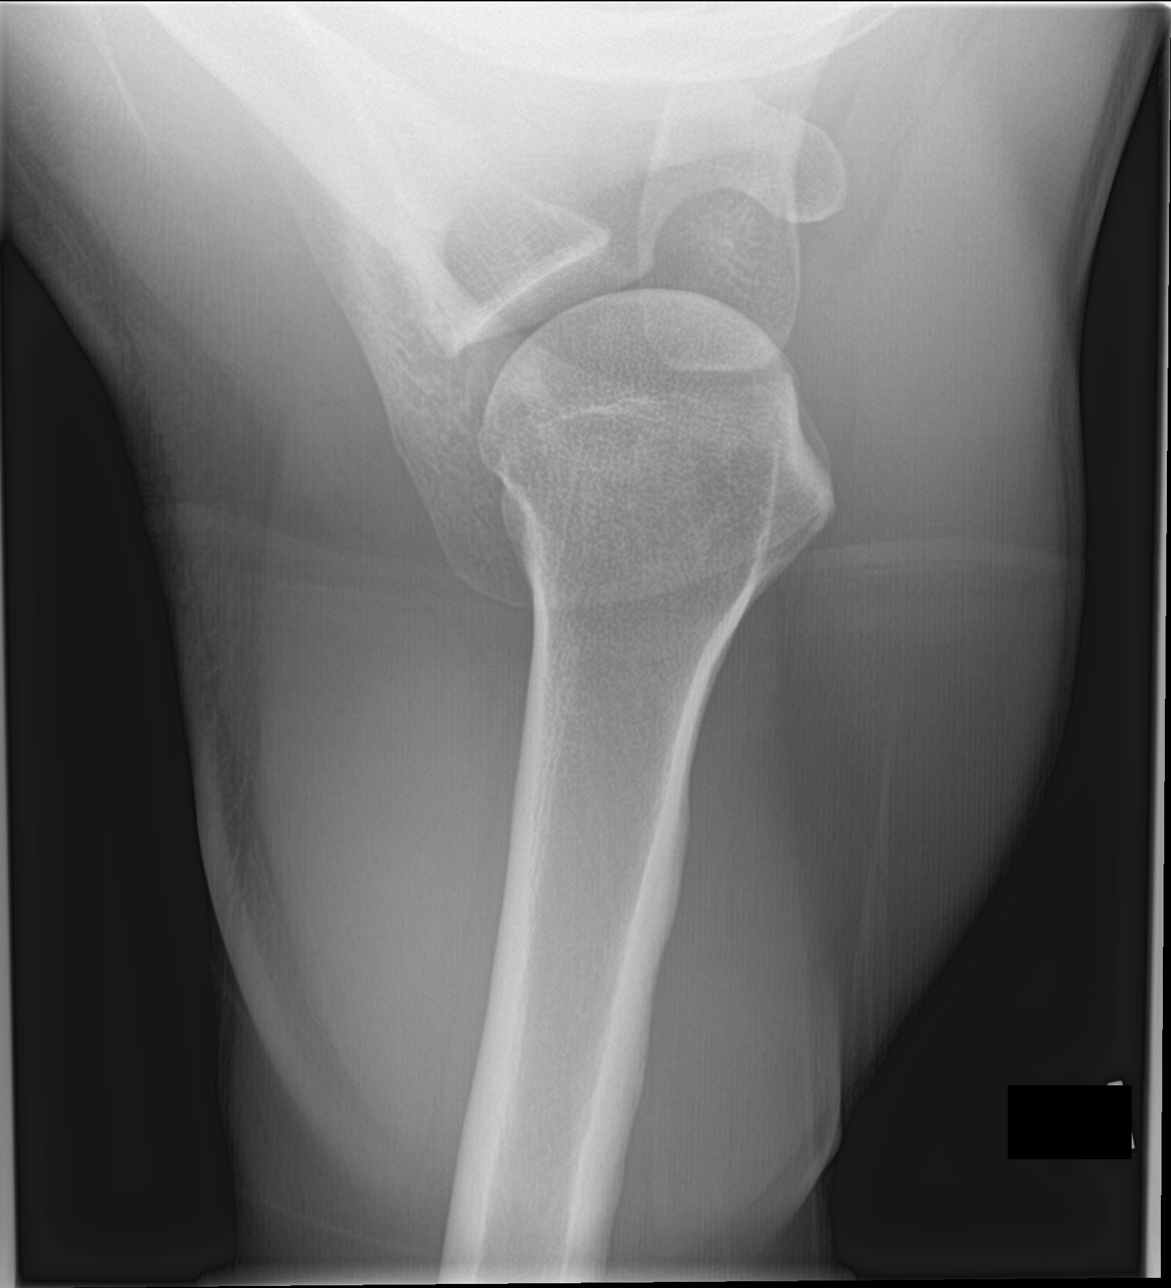

[3 of 3 positions shown; findings below may reference images not displayed]

FINDINGS: No evidence of acute fracture or dislocation of the right shoulder.
Coracoclavicular and acromioclavicular spaces are maintained. On the
Y-view, there is suggestion of cortical loss and focal lucency in
the inferior scapula. This is probably artifact but a destructive
bone lesion or infection may be present. Suggest obtaining scapular
views for further evaluation.
IMPRESSION: 1. No acute bony abnormalities demonstrated in the right shoulder.
2. Possible cortical loss in focal lucency in the inferior scapula
versus artifact. Suggest obtaining scapular views for further
evaluation.

## 2019-12-17 ENCOUNTER — Encounter (HOSPITAL_BASED_OUTPATIENT_CLINIC_OR_DEPARTMENT_OTHER): Payer: Self-pay | Admitting: Emergency Medicine

## 2019-12-17 ENCOUNTER — Other Ambulatory Visit: Payer: Self-pay

## 2019-12-17 ENCOUNTER — Emergency Department (HOSPITAL_BASED_OUTPATIENT_CLINIC_OR_DEPARTMENT_OTHER)
Admission: EM | Admit: 2019-12-17 | Discharge: 2019-12-17 | Disposition: A | Discharge status: Home / Self Care | Payer: Self-pay | Attending: Emergency Medicine | Admitting: Emergency Medicine

## 2019-12-17 DIAGNOSIS — Y929 Unspecified place or not applicable: Secondary | ICD-10-CM | POA: Insufficient documentation

## 2019-12-17 DIAGNOSIS — F17290 Nicotine dependence, other tobacco product, uncomplicated: Secondary | ICD-10-CM | POA: Insufficient documentation

## 2019-12-17 DIAGNOSIS — S01512A Laceration without foreign body of oral cavity, initial encounter: Secondary | ICD-10-CM | POA: Insufficient documentation

## 2019-12-17 DIAGNOSIS — Y939 Activity, unspecified: Secondary | ICD-10-CM | POA: Insufficient documentation

## 2019-12-17 DIAGNOSIS — Y999 Unspecified external cause status: Secondary | ICD-10-CM | POA: Insufficient documentation

## 2019-12-17 MED ORDER — PENICILLIN V POTASSIUM 500 MG PO TABS: 500.0000 mg | ORAL_TABLET | Freq: Three times a day (TID) | ORAL | 0 refills | Status: DC

## 2019-12-17 MED ORDER — PENICILLIN V POTASSIUM 250 MG PO TABS
500.0000 mg | ORAL_TABLET | Freq: Once | ORAL | Status: AC
  Administered 2019-12-17: 06:00:00 500 mg via ORAL
  Filled 2019-12-17: qty 2

## 2019-12-17 MED ORDER — BUPIVACAINE-EPINEPHRINE (PF) 0.5% -1:200000 IJ SOLN
INTRAMUSCULAR | Status: AC
  Administered 2019-12-17: 06:00:00 1.8 mL
  Filled 2019-12-17: qty 1.8

## 2019-12-17 MED ORDER — LIDOCAINE HCL (PF) 1 % IJ SOLN
INTRAMUSCULAR | Status: AC
  Filled 2019-12-17: qty 5
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
## 2019-12-17 NOTE — ED Notes (Signed)
Pt no longer in waiting area, out to car. Treatment room ready, registration to call RN when patient returns.
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
## 2019-12-17 NOTE — ED Notes (Signed)
EDP at bedside to suture 

## 2019-12-31 ENCOUNTER — Encounter (HOSPITAL_BASED_OUTPATIENT_CLINIC_OR_DEPARTMENT_OTHER): Payer: Self-pay | Admitting: Emergency Medicine

## 2019-12-31 ENCOUNTER — Emergency Department (HOSPITAL_BASED_OUTPATIENT_CLINIC_OR_DEPARTMENT_OTHER)
Admission: EM | Admit: 2019-12-31 | Discharge: 2019-12-31 | Disposition: A | Discharge status: Home / Self Care | Payer: Self-pay | Attending: Emergency Medicine | Admitting: Emergency Medicine

## 2019-12-31 ENCOUNTER — Other Ambulatory Visit: Payer: Self-pay

## 2019-12-31 DIAGNOSIS — N342 Other urethritis: Secondary | ICD-10-CM

## 2019-12-31 DIAGNOSIS — F17290 Nicotine dependence, other tobacco product, uncomplicated: Secondary | ICD-10-CM | POA: Insufficient documentation

## 2019-12-31 DIAGNOSIS — N341 Nonspecific urethritis: Secondary | ICD-10-CM | POA: Insufficient documentation

## 2019-12-31 MED ORDER — DOXYCYCLINE HYCLATE 100 MG PO CAPS: 100.0000 mg | ORAL_CAPSULE | Freq: Two times a day (BID) | ORAL | 0 refills | Status: DC

## 2019-12-31 MED ORDER — CEFTRIAXONE SODIUM 500 MG IJ SOLR
500.0000 mg | Freq: Once | INTRAMUSCULAR | Status: AC
  Administered 2019-12-31: 500 mg via INTRAMUSCULAR
  Filled 2019-12-31: qty 500
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
## 2019-12-31 NOTE — Discharge Instructions (Signed)
Please read and follow all provided instructions.  Your diagnoses today include:  1. Urethritis     Tests performed today include:  Test for gonorrhea and chlamydia.   Vital signs. See below for your results today.   Medications:  For treatment of gonorrhea: You were treated with a rocephin (shot) today.   For treatment of chlamydia: Please hold the prescription for doxycycline until the results return.  If you test positive for chlamydia you should fill the doxycycline and take the entire prescription.  If you test negative, you can discard the prescription. You can check your results on the internet through your MyChart. Results typically return in 48 hours.   Home care instructions:  Read educational materials contained in this packet and follow any instructions provided.   You should tell your partners about your infection and avoid having sex for one week to allow time for the medicine to work.  Sexually transmitted disease testing also available at:   [facility], [facility]  [address], [place], phone [phone] or [phone]    Monday - Friday, call for an appointment  Return instructions:   Please return to the Emergency Department if you experience worsening symptoms.   Please return if you have any other emergent concerns.  Additional Information:  Your vital signs today were: BP 126/82 (BP Location: Left Arm)   Pulse 88   Temp 98.7 F (37.1 C) (Oral)   Resp 18   Ht 5\' 7"  (1.702 m)   Wt 68 kg   SpO2 99%   BMI 23.49 kg/m  If your blood pressure (BP) was elevated above 135/85 this visit, please have this repeated by your doctor within one month. --------------
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
## 2020-01-01 LAB — GC/CHLAMYDIA PROBE AMP (~~LOC~~) NOT AT ARMC
Chlamydia: NEGATIVE
Comment: NEGATIVE
Comment: NORMAL
Neisseria Gonorrhea: POSITIVE — AB

## 2020-01-12 LAB — MOLECULAR ANCILLARY ONLY
Chlamydia: NEGATIVE
Comment: NEGATIVE
Comment: NORMAL
Neisseria Gonorrhea: POSITIVE — AB

## 2020-02-02 ENCOUNTER — Emergency Department (HOSPITAL_BASED_OUTPATIENT_CLINIC_OR_DEPARTMENT_OTHER)
Admission: EM | Admit: 2020-02-02 | Discharge: 2020-02-02 | Disposition: A | Discharge status: Home / Self Care | Payer: Self-pay | Attending: Emergency Medicine | Admitting: Emergency Medicine

## 2020-02-02 ENCOUNTER — Other Ambulatory Visit: Payer: Self-pay

## 2020-02-02 ENCOUNTER — Encounter (HOSPITAL_BASED_OUTPATIENT_CLINIC_OR_DEPARTMENT_OTHER): Payer: Self-pay | Admitting: Emergency Medicine

## 2020-02-02 DIAGNOSIS — F17290 Nicotine dependence, other tobacco product, uncomplicated: Secondary | ICD-10-CM | POA: Insufficient documentation

## 2020-02-02 DIAGNOSIS — R369 Urethral discharge, unspecified: Secondary | ICD-10-CM | POA: Insufficient documentation

## 2020-02-02 LAB — URINALYSIS, MICROSCOPIC (REFLEX): Squamous Epithelial / LPF: NONE SEEN (ref 0–5)

## 2020-02-02 LAB — URINALYSIS, ROUTINE W REFLEX MICROSCOPIC
Bilirubin Urine: NEGATIVE
Glucose, UA: NEGATIVE mg/dL
Hgb urine dipstick: NEGATIVE
Ketones, ur: NEGATIVE mg/dL
Nitrite: NEGATIVE
Protein, ur: NEGATIVE mg/dL
Specific Gravity, Urine: 1.025 (ref 1.005–1.030)
pH: 6.5 (ref 5.0–8.0)

## 2020-02-02 MED ORDER — CEFTRIAXONE SODIUM 500 MG IJ SOLR
500.0000 mg | Freq: Once | INTRAMUSCULAR | Status: AC
  Administered 2020-02-02: 500 mg via INTRAMUSCULAR
  Filled 2020-02-02: qty 500

## 2020-02-02 MED ORDER — DOXYCYCLINE HYCLATE 100 MG PO TABS
100.0000 mg | ORAL_TABLET | Freq: Once | ORAL | Status: AC
  Administered 2020-02-02: 100 mg via ORAL
  Filled 2020-02-02: qty 1

## 2020-02-02 MED ORDER — DOXYCYCLINE HYCLATE 100 MG PO CAPS: 100.0000 mg | ORAL_CAPSULE | Freq: Two times a day (BID) | ORAL | 0 refills | Status: DC

## 2020-02-02 MED ORDER — METRONIDAZOLE 500 MG PO TABS
2000.0000 mg | ORAL_TABLET | Freq: Once | ORAL | Status: AC
  Administered 2020-02-02: 2000 mg via ORAL
  Filled 2020-02-02: qty 4

## 2020-02-02 MED ORDER — LIDOCAINE HCL (PF) 1 % IJ SOLN
INTRAMUSCULAR | Status: AC
  Administered 2020-02-02: 1 mL
  Filled 2020-02-02: qty 5

## 2020-02-02 NOTE — ED Provider Notes (Signed)
[facility] EMERGENCY DEPARTMENT Provider Note  CSN: [id number] Arrival date & time: 02/02/20 0125  Chief Complaint(s) SEXUALLY TRANSMITTED DISEASE  HPI Kevin Cummings is a 29 y.o. male here for 2 days of penile discharge. Was treated for GC last month and discharge resolved. He was with 2 new partners since, w/o protection. Endorsing dysuria. No other sxs.  HPI  Past Medical History History reviewed. No pertinent past medical history. There are no problems to display for this patient.  Home Medication(s) Prior to Admission medications   Medication Sig Start Date End Date Taking? Authorizing Provider  doxycycline (VIBRAMYCIN) 100 MG capsule Take 1 capsule (100 mg total) by mouth 2 (two) times daily. 02/02/20   Nira Conn, MD                                                                                                                                    Past Surgical History History reviewed. No pertinent surgical history. Family History No family history on file.  Social History Social History   Tobacco Use  . Smoking status: Current Some Day Smoker    Types: Cigars  . Smokeless tobacco: Never Used  Vaping Use  . Vaping Use: Never used  Substance Use Topics  . Alcohol use: Yes    Comment: socially  . Drug use: No   Allergies Patient has no known allergies.  Review of Systems Review of Systems All other systems are reviewed and are negative for acute change except as noted in the HPI  Physical Exam Vital Signs  I have reviewed the triage vital signs BP 119/81 (BP Location: Right Arm)   Pulse 60   Temp 97.6 F (36.4 C) (Oral)   Resp 14   Ht 5\' 7"  (1.702 m)   Wt 68 kg   SpO2 100%   BMI 23.49 kg/m   Physical Exam Vitals reviewed.  Constitutional:      General: He is not in acute distress.    Appearance: He is well-developed. He is not diaphoretic.  HENT:     Head: Normocephalic and atraumatic.     Jaw: No trismus.     Right Ear:  External ear normal.     Left Ear: External ear normal.     Nose: Nose normal.  Eyes:     General: No scleral icterus.    Conjunctiva/sclera: Conjunctivae normal.  Neck:     Trachea: Phonation normal.  Cardiovascular:     Rate and Rhythm: Normal rate and regular rhythm.  Pulmonary:     Effort: Pulmonary effort is normal. No respiratory distress.     Breath sounds: No stridor.  Abdominal:     General: There is no distension.  Genitourinary:    Penis: Circumcised. Discharge present.   Musculoskeletal:        General: Normal range of motion.     Cervical back: Normal range of motion.  Neurological:  Mental Status: He is alert and oriented to person, place, and time.  Psychiatric:        Behavior: Behavior normal.     ED Results and Treatments Labs (all labs ordered are listed, but only abnormal results are displayed) Labs Reviewed  URINALYSIS, ROUTINE W REFLEX MICROSCOPIC - Abnormal; Notable for the following components:      Result Value   APPearance CLOUDY (*)    Leukocytes,Ua SMALL (*)    All other components within normal limits  URINALYSIS, MICROSCOPIC (REFLEX) - Abnormal; Notable for the following components:   Bacteria, UA FEW (*)    All other components within normal limits  GC/CHLAMYDIA PROBE AMP ([place]) NOT AT [facility]                                                                                                                         EKG  EKG Interpretation  Date/Time:    Ventricular Rate:    PR Interval:    QRS Duration:   QT Interval:    QTC Calculation:   R Axis:     Text Interpretation:        Radiology No results found.  Pertinent labs & imaging results that were available during my care of the patient were reviewed by me and considered in my medical decision making (see chart for details).  Medications Ordered in ED Medications  cefTRIAXone (ROCEPHIN) injection 500 mg (has no administration in time range)  doxycycline (VIBRA-TABS)  tablet 100 mg (has no administration in time range)  metroNIDAZOLE (FLAGYL) tablet 2,000 mg (has no administration in time range)                                                                                                                                    Procedures Procedures  (including critical care time)  Medical Decision Making / ED Course I have reviewed the nursing notes for this encounter and the patient's prior records (if available in EHR or on provided paperwork).   Kevin Cummings was evaluated in Emergency Department on 02/02/2020 for the symptoms described in the history of present illness. He was evaluated in the context of the global COVID-19 pandemic, which necessitated consideration that the patient might be at risk for infection with the SARS-CoV-2 virus that causes COVID-19. Institutional protocols and algorithms that pertain to the evaluation of patients at risk for COVID-19 are  in a state of rapid change based on information released by regulatory bodies including the CDC and federal and state organizations. These policies and algorithms were followed during the patient's care in the ED.  Penile DC Consistent with likely STI. Empirically treated for GC/Chlam and Trich.       Final Clinical Impression(s) / ED Diagnoses Final diagnoses:  Penile discharge    The patient appears reasonably screened and/or stabilized for discharge and I doubt any other medical condition or other [facility] requiring further screening, evaluation, or treatment in the ED at this time prior to discharge. Safe for discharge with strict return precautions.  Disposition: Discharge  Condition: Good  I have discussed the results, Dx and Tx plan with the patient/family who expressed understanding and agree(s) with the plan. Discharge instructions discussed at length. The patient/family was given strict return precautions who verbalized understanding of the instructions. No further questions at  time of discharge.    ED Discharge Orders         Ordered    doxycycline (VIBRAMYCIN) 100 MG capsule  2 times daily     Discontinue  Reprint     02/02/20 0505            This chart was dictated using voice recognition software.  Despite best efforts to proofread,  errors can occur which can change the documentation meaning.   Fatima Blank, MD 02/02/20 [phone]

## 2020-02-02 NOTE — ED Triage Notes (Signed)
Co penile discharge. Pt was seen and treated for gonorrhea. Symptoms resolved and has returned.

## 2020-02-05 LAB — GC/CHLAMYDIA PROBE AMP (~~LOC~~) NOT AT ARMC
Chlamydia: NEGATIVE
Comment: NEGATIVE
Comment: NORMAL
Neisseria Gonorrhea: POSITIVE — AB

## 2020-04-11 ENCOUNTER — Emergency Department (HOSPITAL_BASED_OUTPATIENT_CLINIC_OR_DEPARTMENT_OTHER)
Admission: EM | Admit: 2020-04-11 | Discharge: 2020-04-11 | Disposition: A | Discharge status: Home / Self Care | Payer: Self-pay | Attending: Emergency Medicine | Admitting: Emergency Medicine

## 2020-04-11 ENCOUNTER — Other Ambulatory Visit: Payer: Self-pay

## 2020-04-11 ENCOUNTER — Encounter (HOSPITAL_BASED_OUTPATIENT_CLINIC_OR_DEPARTMENT_OTHER): Payer: Self-pay | Admitting: Emergency Medicine

## 2020-04-11 DIAGNOSIS — Z20822 Contact with and (suspected) exposure to covid-19: Secondary | ICD-10-CM | POA: Insufficient documentation

## 2020-04-11 DIAGNOSIS — F17210 Nicotine dependence, cigarettes, uncomplicated: Secondary | ICD-10-CM | POA: Insufficient documentation

## 2020-04-11 DIAGNOSIS — J028 Acute pharyngitis due to other specified organisms: Secondary | ICD-10-CM | POA: Insufficient documentation

## 2020-04-11 DIAGNOSIS — J029 Acute pharyngitis, unspecified: Secondary | ICD-10-CM

## 2020-04-11 DIAGNOSIS — B9789 Other viral agents as the cause of diseases classified elsewhere: Secondary | ICD-10-CM | POA: Insufficient documentation

## 2020-04-11 LAB — SARS CORONAVIRUS 2 BY RT PCR (HOSPITAL ORDER, PERFORMED IN ~~LOC~~ HOSPITAL LAB): SARS Coronavirus 2: NEGATIVE

## 2020-04-11 LAB — GROUP A STREP BY PCR: Group A Strep by PCR: NOT DETECTED

## 2020-04-11 MED ORDER — DEXAMETHASONE 6 MG PO TABS
10.0000 mg | ORAL_TABLET | Freq: Once | ORAL | Status: AC
  Administered 2020-04-11: 15:00:00 10 mg via ORAL
  Filled 2020-04-11: qty 1

## 2020-04-11 NOTE — Discharge Instructions (Addendum)
Your Covid test and strep test are both negative.  Suspect some other virus is causing your sore throat.  Please take Tylenol and Motrin as needed for pain.  Please return if symptoms worsen.

## 2020-04-11 NOTE — ED Provider Notes (Signed)
[facility] EMERGENCY DEPARTMENT Provider Note   CSN: [id number] Arrival date & time: 04/11/20  1305     History Chief Complaint  Patient presents with  . Sore Throat    Kevin Cummings is a 29 y.o. male.  The history is provided by the patient.  Sore Throat This is a new problem. The current episode started yesterday. The problem occurs constantly. The problem has not changed since onset.Nothing aggravates the symptoms. Nothing relieves the symptoms. He has tried nothing for the symptoms. The treatment provided no relief.       History reviewed. No pertinent past medical history.  There are no problems to display for this patient.   History reviewed. No pertinent surgical history.     No family history on file.  Social History   Tobacco Use  . Smoking status: Current Some Day Smoker    Types: Cigars  . Smokeless tobacco: Never Used  Vaping Use  . Vaping Use: Never used  Substance Use Topics  . Alcohol use: Yes    Comment: socially  . Drug use: No    Home Medications Prior to Admission medications   Not on File    Allergies    Patient has no known allergies.  Review of Systems   Review of Systems  Constitutional: Negative for chills and fever.  HENT: Positive for sore throat. Negative for congestion, dental problem, sinus pressure, sinus pain and trouble swallowing.   Skin: Negative for color change, pallor, rash and wound.    Physical Exam Updated Vital Signs  ED Triage Vitals  Enc Vitals Group     BP 04/11/20 1312 126/90     Pulse Rate 04/11/20 1312 90     Resp 04/11/20 1312 16     Temp 04/11/20 1312 99.8 F (37.7 C)     Temp Source 04/11/20 1312 Oral     SpO2 04/11/20 1312 95 %     Weight 04/11/20 1310 160 lb (72.6 kg)     Height 04/11/20 1310 5\' 7"  (1.702 m)     Head Circumference --      Peak Flow --      Pain Score 04/11/20 1310 10     Pain Loc --      Pain Edu? --      Excl. in GC? --     Physical  Exam Constitutional:      General: He is not in acute distress.    Appearance: He is not ill-appearing.  HENT:     Head: Normocephalic and atraumatic.     Nose: No congestion.     Mouth/Throat:     Mouth: Mucous membranes are moist. No oral lesions.     Pharynx: Uvula midline. Posterior oropharyngeal erythema present. No pharyngeal swelling or oropharyngeal exudate.     Tonsils: No tonsillar exudate or tonsillar abscesses.  Skin:    Capillary Refill: Capillary refill takes less than 2 seconds.  Neurological:     Mental Status: He is alert.     ED Results / Procedures / Treatments   Labs (all labs ordered are listed, but only abnormal results are displayed) Labs Reviewed  GROUP A STREP BY PCR  SARS CORONAVIRUS 2 BY RT PCR (HOSPITAL ORDER, PERFORMED IN [facility])    EKG None  Radiology No results found.  Procedures Procedures (including critical care time)  Medications Ordered in ED Medications  dexamethasone (DECADRON) tablet 10 mg (has no administration in time range)  ED Course  I have reviewed the triage vital signs and the nursing notes.  Pertinent labs & imaging results that were available during my care of the patient were reviewed by me and considered in my medical decision making (see chart for details).    MDM Rules/Calculators/A&P                          Kevin Cummings is a 28 year old male who presents to the ED with sore throat.  Unremarkable vitals.  No fever.  Pain since yesterday.  Does have some redness of the back of the throat.  No concern for abscess.  Normal phonation.  No trismus.  Covid test is negative and strep test is negative.  However suspect a viral pharyngitis.  Was given Decadron.  Recommend Tylenol Motrin for pain as well.  Understands return precautions and discharged from the ED in good condition.  This chart was dictated using voice recognition software.  Despite best efforts to proofread,  errors can occur which  can change the documentation meaning.   Final Clinical Impression(s) / ED Diagnoses Final diagnoses:  Viral pharyngitis    Rx / DC Orders ED Discharge Orders    None       Virgina Norfolk, DO 04/11/20 1434

## 2020-04-11 NOTE — ED Triage Notes (Signed)
Sore throat since yesterday.
